# Patient Record
Sex: Female | Born: 1985 | Race: White | Hispanic: No | Marital: Married
Health system: Midwestern US, Academic
[De-identification: ages and names within clinical notes are randomized; demographics above are authoritative.]

---

## 2017-02-28 ENCOUNTER — Encounter: Admit: 2017-02-28 | Discharge: 2017-03-01

## 2017-02-28 DIAGNOSIS — R69 Illness, unspecified: Principal | ICD-10-CM

## 2017-05-02 ENCOUNTER — Encounter: Admit: 2017-05-02 | Discharge: 2017-05-03

## 2017-05-02 DIAGNOSIS — R69 Illness, unspecified: Principal | ICD-10-CM

## 2017-06-06 ENCOUNTER — Encounter: Admit: 2017-06-06 | Discharge: 2017-06-06 | Payer: Commercial Managed Care - Pharmacy Benefit Manager

## 2017-06-06 DIAGNOSIS — M25551 Pain in right hip: Principal | ICD-10-CM

## 2017-06-08 ENCOUNTER — Encounter: Admit: 2017-06-08 | Discharge: 2017-06-08 | Payer: Commercial Managed Care - Pharmacy Benefit Manager

## 2017-06-08 ENCOUNTER — Ambulatory Visit: Admit: 2017-06-08 | Discharge: 2017-06-08 | Payer: Commercial Managed Care - Pharmacy Benefit Manager

## 2017-06-08 ENCOUNTER — Ambulatory Visit: Admit: 2017-06-08 | Discharge: 2017-06-08 | Payer: Private Health Insurance - Indemnity

## 2017-06-08 DIAGNOSIS — M25551 Pain in right hip: Principal | ICD-10-CM

## 2017-06-08 DIAGNOSIS — R69 Illness, unspecified: Principal | ICD-10-CM

## 2017-06-08 DIAGNOSIS — M25859 Other specified joint disorders, unspecified hip: ICD-10-CM

## 2017-06-10 ENCOUNTER — Encounter: Admit: 2017-06-10 | Discharge: 2017-06-10 | Payer: Commercial Managed Care - Pharmacy Benefit Manager

## 2017-06-10 DIAGNOSIS — R69 Illness, unspecified: Principal | ICD-10-CM

## 2017-09-19 ENCOUNTER — Encounter: Admit: 2017-09-19 | Discharge: 2017-09-19 | Payer: Commercial Managed Care - Pharmacy Benefit Manager

## 2017-09-22 ENCOUNTER — Encounter: Admit: 2017-09-22 | Discharge: 2017-09-22 | Payer: Commercial Managed Care - Pharmacy Benefit Manager

## 2017-10-27 ENCOUNTER — Encounter: Admit: 2017-10-27 | Discharge: 2017-10-27 | Payer: Commercial Managed Care - Pharmacy Benefit Manager

## 2017-10-27 DIAGNOSIS — M25851 Other specified joint disorders, right hip: Principal | ICD-10-CM

## 2017-10-27 DIAGNOSIS — S73191D Other sprain of right hip, subsequent encounter: ICD-10-CM

## 2017-10-27 MED ORDER — LACTATED RINGERS IV SOLP
1000 mL | INTRAVENOUS | 0 refills | Status: CN
Start: 2017-10-27 — End: ?

## 2017-10-27 MED ORDER — CEFAZOLIN 1 GRAM IJ SOLR
2 g | Freq: Once | INTRAVENOUS | 0 refills | Status: CN
Start: 2017-10-27 — End: ?

## 2017-10-27 MED ORDER — LIDOCAINE (PF) 10 MG/ML (1 %) IJ SOLN
.1-2 mL | INTRAMUSCULAR | 0 refills | Status: CN | PRN
Start: 2017-10-27 — End: ?

## 2017-12-20 ENCOUNTER — Encounter: Admit: 2017-12-20 | Discharge: 2017-12-20 | Payer: Commercial Managed Care - Pharmacy Benefit Manager

## 2017-12-20 DIAGNOSIS — M25851 Other specified joint disorders, right hip: Principal | ICD-10-CM

## 2017-12-20 MED ORDER — CEFAZOLIN INJ 1GM IVP
2 g | Freq: Once | INTRAVENOUS | 0 refills | Status: CN
Start: 2017-12-20 — End: ?

## 2017-12-26 ENCOUNTER — Encounter: Admit: 2017-12-26 | Discharge: 2017-12-26 | Payer: Commercial Managed Care - Pharmacy Benefit Manager

## 2017-12-26 ENCOUNTER — Ambulatory Visit: Admit: 2017-12-26 | Discharge: 2017-12-27 | Payer: Private Health Insurance - Indemnity

## 2017-12-27 DIAGNOSIS — M25851 Other specified joint disorders, right hip: Principal | ICD-10-CM

## 2017-12-28 ENCOUNTER — Encounter: Admit: 2017-12-28 | Discharge: 2017-12-28 | Payer: Commercial Managed Care - Pharmacy Benefit Manager

## 2017-12-28 DIAGNOSIS — R112 Nausea with vomiting, unspecified: ICD-10-CM

## 2017-12-28 DIAGNOSIS — F99 Mental disorder, not otherwise specified: Principal | ICD-10-CM

## 2018-01-12 ENCOUNTER — Encounter: Admit: 2018-01-12 | Discharge: 2018-01-12 | Payer: Commercial Managed Care - Pharmacy Benefit Manager

## 2018-01-12 ENCOUNTER — Ambulatory Visit: Admit: 2018-01-12 | Discharge: 2018-01-13 | Payer: Private Health Insurance - Indemnity

## 2018-01-12 ENCOUNTER — Ambulatory Visit: Admit: 2018-01-12 | Discharge: 2018-01-12 | Payer: Commercial Managed Care - Pharmacy Benefit Manager

## 2018-01-12 ENCOUNTER — Ambulatory Visit: Admit: 2018-01-12 | Discharge: 2018-01-12 | Payer: Private Health Insurance - Indemnity

## 2018-01-12 DIAGNOSIS — S73191A Other sprain of right hip, initial encounter: ICD-10-CM

## 2018-01-12 DIAGNOSIS — R112 Nausea with vomiting, unspecified: ICD-10-CM

## 2018-01-12 DIAGNOSIS — M25851 Other specified joint disorders, right hip: Principal | ICD-10-CM

## 2018-01-12 DIAGNOSIS — F99 Mental disorder, not otherwise specified: Principal | ICD-10-CM

## 2018-01-12 MED ORDER — FENTANYL CITRATE (PF) 50 MCG/ML IJ SOLN
0 refills | Status: DC
Start: 2018-01-12 — End: 2018-01-12
  Administered 2018-01-12 (×3): 50 ug via INTRAVENOUS
  Administered 2018-01-12: 17:00:00 100 ug via INTRAVENOUS

## 2018-01-12 MED ORDER — FENTANYL CITRATE (PF) 50 MCG/ML IJ SOLN
25 ug | INTRAVENOUS | 0 refills | Status: DC | PRN
Start: 2018-01-12 — End: 2018-01-12

## 2018-01-12 MED ORDER — MEPERIDINE (PF) 25 MG/ML IJ SYRG
12.5 mg | INTRAVENOUS | 0 refills | Status: DC | PRN
Start: 2018-01-12 — End: 2018-01-12

## 2018-01-12 MED ORDER — SCOPOLAMINE BASE 1 MG OVER 3 DAYS TD PT3D
1 | Freq: Once | TRANSDERMAL | 0 refills | Status: DC
Start: 2018-01-12 — End: 2018-01-12
  Administered 2018-01-12: 16:00:00 1 via TRANSDERMAL

## 2018-01-12 MED ORDER — PROPOFOL INJ 10 MG/ML IV VIAL
0 refills | Status: DC
Start: 2018-01-12 — End: 2018-01-12
  Administered 2018-01-12: 17:00:00 200 mg via INTRAVENOUS

## 2018-01-12 MED ORDER — FAMOTIDINE (PF) 20 MG/2 ML IV SOLN
0 refills | Status: DC
Start: 2018-01-12 — End: 2018-01-12
  Administered 2018-01-12: 18:00:00 20 mg via INTRAVENOUS

## 2018-01-12 MED ORDER — NAPROXEN 500 MG PO TAB
500 mg | ORAL_TABLET | Freq: Two times a day (BID) | ORAL | 1 refills | Status: AC
Start: 2018-01-12 — End: 2018-03-06
  Filled 2018-01-12 (×2): qty 60, 30d supply, fill #1

## 2018-01-12 MED ORDER — ONDANSETRON HCL (PF) 4 MG/2 ML IJ SOLN
INTRAVENOUS | 0 refills | Status: DC
Start: 2018-01-12 — End: 2018-01-12
  Administered 2018-01-12: 19:00:00 4 mg via INTRAVENOUS

## 2018-01-12 MED ORDER — ONDANSETRON HCL (PF) 4 MG/2 ML IJ SOLN
4 mg | Freq: Once | INTRAVENOUS | 0 refills | Status: DC | PRN
Start: 2018-01-12 — End: 2018-01-12

## 2018-01-12 MED ORDER — CEFAZOLIN INJ 1GM IVP
2 g | Freq: Once | INTRAVENOUS | 0 refills | Status: CP
Start: 2018-01-12 — End: ?
  Administered 2018-01-12: 18:00:00 3 g via INTRAVENOUS

## 2018-01-12 MED ORDER — LIDOCAINE (PF) 10 MG/ML (1 %) IJ SOLN
0 refills | Status: DC
Start: 2018-01-12 — End: 2018-01-12
  Administered 2018-01-12: 19:00:00 3 mL

## 2018-01-12 MED ORDER — HALOPERIDOL LACTATE 5 MG/ML IJ SOLN
1 mg | Freq: Once | INTRAVENOUS | 0 refills | Status: DC | PRN
Start: 2018-01-12 — End: 2018-01-12

## 2018-01-12 MED ORDER — ROCURONIUM 10 MG/ML IV SOLN
INTRAVENOUS | 0 refills | Status: DC
Start: 2018-01-12 — End: 2018-01-12
  Administered 2018-01-12: 17:00:00 50 mg via INTRAVENOUS

## 2018-01-12 MED ORDER — DEXAMETHASONE SODIUM PHOSPHATE 4 MG/ML IJ SOLN
INTRAVENOUS | 0 refills | Status: DC
Start: 2018-01-12 — End: 2018-01-12
  Administered 2018-01-12: 18:00:00 5 mg via INTRAVENOUS

## 2018-01-12 MED ORDER — GABAPENTIN 300 MG PO CAP
300 mg | Freq: Once | ORAL | 0 refills | Status: CP
Start: 2018-01-12 — End: ?
  Administered 2018-01-12: 16:00:00 300 mg via ORAL

## 2018-01-12 MED ORDER — DIAZEPAM 5 MG PO TAB
5 mg | ORAL_TABLET | ORAL | 0 refills | 7.00000 days | Status: AC | PRN
Start: 2018-01-12 — End: 2018-01-27
  Filled 2018-01-12 (×2): qty 40, 10d supply, fill #1

## 2018-01-12 MED ORDER — PROPOFOL 10 MG/ML IV EMUL (INFUSION)(AM)(OR)
0 refills | Status: DC
Start: 2018-01-12 — End: 2018-01-12
  Administered 2018-01-12: 18:00:00 175 ug/kg/min via INTRAVENOUS

## 2018-01-12 MED ORDER — SUGAMMADEX 100 MG/ML IV SOLN
INTRAVENOUS | 0 refills | Status: DC
Start: 2018-01-12 — End: 2018-01-12
  Administered 2018-01-12: 19:00:00 200 mg via INTRAVENOUS

## 2018-01-12 MED ORDER — LIDOCAINE (PF) 200 MG/10 ML (2 %) IJ SYRG
0 refills | Status: DC
Start: 2018-01-12 — End: 2018-01-12
  Administered 2018-01-12: 17:00:00 60 mg via INTRAVENOUS

## 2018-01-12 MED ORDER — CELECOXIB 200 MG PO CAP
200 mg | Freq: Once | ORAL | 0 refills | Status: CP
Start: 2018-01-12 — End: ?
  Administered 2018-01-12: 16:00:00 200 mg via ORAL

## 2018-01-12 MED ORDER — LIDOCAINE (PF) 10 MG/ML (1 %) IJ SOLN
.1-2 mL | INTRAMUSCULAR | 0 refills | Status: DC | PRN
Start: 2018-01-12 — End: 2018-01-12

## 2018-01-12 MED ORDER — ROPIVACAINE (PF) 2 MG/ML (0.2 %) IJ SOLN
0 refills | Status: DC
Start: 2018-01-12 — End: 2018-01-12
  Administered 2018-01-12: 19:00:00 60 mL

## 2018-01-12 MED ORDER — DIPHENHYDRAMINE HCL 50 MG/ML IJ SOLN
25 mg | Freq: Once | INTRAVENOUS | 0 refills | Status: DC | PRN
Start: 2018-01-12 — End: 2018-01-12

## 2018-01-12 MED ORDER — LACTATED RINGERS IV SOLP
INTRAVENOUS | 0 refills | Status: DC
Start: 2018-01-12 — End: 2018-01-12
  Administered 2018-01-12 (×2): 1000.000 mL via INTRAVENOUS

## 2018-01-12 MED ORDER — LIDOCAINE (PF) 10 MG/ML (1 %) IJ SOLN
SUBCUTANEOUS | 0 refills | Status: DC
Start: 2018-01-12 — End: 2018-01-12
  Administered 2018-01-12: 19:00:00 3 mL via SUBCUTANEOUS

## 2018-01-12 MED ORDER — PROPOFOL 10 MG/ML IV EMUL 20 ML (INFUSION)(AM)(OR)
0 refills | Status: DC
Start: 2018-01-12 — End: 2018-01-12
  Administered 2018-01-12: 18:00:00 125 ug/kg/min via INTRAVENOUS

## 2018-01-12 MED ORDER — OXYCODONE-ACETAMINOPHEN 5-325 MG PO TAB
1-2 | ORAL_TABLET | ORAL | 0 refills | 2.00000 days | Status: AC | PRN
Start: 2018-01-12 — End: 2018-01-27
  Filled 2018-01-12 (×2): qty 60, 7d supply, fill #1

## 2018-01-12 MED ORDER — MIDAZOLAM 1 MG/ML IJ SOLN
INTRAVENOUS | 0 refills | Status: DC
Start: 2018-01-12 — End: 2018-01-12
  Administered 2018-01-12: 17:00:00 2 mg via INTRAVENOUS

## 2018-01-12 MED ORDER — OXYCODONE 5 MG PO TAB
5-10 mg | Freq: Once | ORAL | 0 refills | Status: CP | PRN
Start: 2018-01-12 — End: ?
  Administered 2018-01-12: 21:00:00 5 mg via ORAL

## 2018-01-12 MED ORDER — EPINEPHRINE 3MG NS 3000ML IRR(OR)
0 refills | Status: DC
Start: 2018-01-12 — End: 2018-01-12
  Administered 2018-01-12 (×2): 3000 mL

## 2018-01-12 MED ORDER — PATCH DOCUMENTATION - SCOPOLAMINE BASE 1 MG/72HR
1 | Freq: Two times a day (BID) | TRANSDERMAL | 0 refills | Status: DC
Start: 2018-01-12 — End: 2018-01-12

## 2018-01-12 MED ORDER — ACETAMINOPHEN 500 MG PO TAB
1000 mg | Freq: Once | ORAL | 0 refills | Status: CP
Start: 2018-01-12 — End: ?
  Administered 2018-01-12: 16:00:00 1000 mg via ORAL

## 2018-01-12 MED ORDER — KETAMINE 10 MG/ML IJ SOLN
0 refills | Status: DC
Start: 2018-01-12 — End: 2018-01-12
  Administered 2018-01-12 (×2): 30 mg via INTRAVENOUS

## 2018-01-12 MED ORDER — FENTANYL CITRATE (PF) 50 MCG/ML IJ SOLN
50 ug | INTRAVENOUS | 0 refills | Status: DC | PRN
Start: 2018-01-12 — End: 2018-01-12

## 2018-01-12 MED ORDER — DEXMEDETOMIDINE IN 0.9 % NACL 80 MCG/20 ML (4 MCG/ML) IV SOLN
0 refills | Status: DC
Start: 2018-01-12 — End: 2018-01-12
  Administered 2018-01-12: 18:00:00 20 ug via INTRAVENOUS

## 2018-01-12 MED ORDER — FENTANYL CITRATE (PF) 50 MCG/ML IJ SOLN
50 ug | INTRAVENOUS | 0 refills | Status: DC | PRN
Start: 2018-01-12 — End: 2018-01-12
  Administered 2018-01-12 (×2): 50 ug via INTRAVENOUS

## 2018-01-16 ENCOUNTER — Encounter: Admit: 2018-01-16 | Discharge: 2018-01-16 | Payer: Commercial Managed Care - Pharmacy Benefit Manager

## 2018-01-16 ENCOUNTER — Encounter: Admit: 2018-01-16 | Discharge: 2018-01-17 | Payer: Commercial Managed Care - Pharmacy Benefit Manager

## 2018-01-16 DIAGNOSIS — R112 Nausea with vomiting, unspecified: ICD-10-CM

## 2018-01-16 DIAGNOSIS — F99 Mental disorder, not otherwise specified: Principal | ICD-10-CM

## 2018-01-23 ENCOUNTER — Encounter: Admit: 2018-01-23 | Discharge: 2018-01-24 | Payer: Commercial Managed Care - Pharmacy Benefit Manager

## 2018-01-27 ENCOUNTER — Ambulatory Visit: Admit: 2018-01-27 | Discharge: 2018-01-28 | Payer: Private Health Insurance - Indemnity

## 2018-01-27 ENCOUNTER — Encounter: Admit: 2018-01-27 | Discharge: 2018-01-27 | Payer: Commercial Managed Care - Pharmacy Benefit Manager

## 2018-01-27 DIAGNOSIS — R112 Nausea with vomiting, unspecified: ICD-10-CM

## 2018-01-27 DIAGNOSIS — F99 Mental disorder, not otherwise specified: Principal | ICD-10-CM

## 2018-01-28 DIAGNOSIS — M899 Disorder of bone, unspecified: ICD-10-CM

## 2018-01-28 DIAGNOSIS — M25371 Other instability, right ankle: Principal | ICD-10-CM

## 2018-01-28 DIAGNOSIS — S73191D Other sprain of right hip, subsequent encounter: ICD-10-CM

## 2018-01-28 DIAGNOSIS — M25859 Other specified joint disorders, unspecified hip: ICD-10-CM

## 2018-01-28 DIAGNOSIS — M949 Disorder of cartilage, unspecified: ICD-10-CM

## 2018-01-30 ENCOUNTER — Encounter: Admit: 2018-01-30 | Discharge: 2018-01-30 | Payer: Commercial Managed Care - Pharmacy Benefit Manager

## 2018-02-02 ENCOUNTER — Encounter: Admit: 2018-02-02 | Discharge: 2018-02-02 | Payer: Commercial Managed Care - Pharmacy Benefit Manager

## 2018-02-13 ENCOUNTER — Encounter: Admit: 2018-02-13 | Discharge: 2018-02-13 | Payer: Commercial Managed Care - Pharmacy Benefit Manager

## 2018-02-17 ENCOUNTER — Encounter: Admit: 2018-02-17 | Discharge: 2018-02-17 | Payer: Commercial Managed Care - Pharmacy Benefit Manager

## 2018-02-17 DIAGNOSIS — M25571 Pain in right ankle and joints of right foot: Principal | ICD-10-CM

## 2018-02-20 ENCOUNTER — Encounter: Admit: 2018-02-20 | Discharge: 2018-02-20 | Payer: Commercial Managed Care - Pharmacy Benefit Manager

## 2018-02-20 DIAGNOSIS — R112 Nausea with vomiting, unspecified: ICD-10-CM

## 2018-02-20 DIAGNOSIS — F99 Mental disorder, not otherwise specified: Principal | ICD-10-CM

## 2018-02-21 ENCOUNTER — Encounter: Admit: 2018-02-21 | Discharge: 2018-02-21 | Payer: Commercial Managed Care - Pharmacy Benefit Manager

## 2018-02-21 ENCOUNTER — Ambulatory Visit: Admit: 2018-02-20 | Discharge: 2018-02-21 | Payer: Private Health Insurance - Indemnity

## 2018-02-21 ENCOUNTER — Ambulatory Visit: Admit: 2018-02-21 | Discharge: 2018-02-21 | Payer: Private Health Insurance - Indemnity

## 2018-02-21 ENCOUNTER — Ambulatory Visit: Admit: 2018-02-21 | Discharge: 2018-02-21 | Payer: Commercial Managed Care - Pharmacy Benefit Manager

## 2018-02-21 DIAGNOSIS — M899 Disorder of bone, unspecified: ICD-10-CM

## 2018-02-21 DIAGNOSIS — G8929 Other chronic pain: ICD-10-CM

## 2018-02-21 DIAGNOSIS — M949 Disorder of cartilage, unspecified: ICD-10-CM

## 2018-02-21 DIAGNOSIS — S73191D Other sprain of right hip, subsequent encounter: ICD-10-CM

## 2018-02-21 DIAGNOSIS — F99 Mental disorder, not otherwise specified: Principal | ICD-10-CM

## 2018-02-21 DIAGNOSIS — M25571 Pain in right ankle and joints of right foot: Principal | ICD-10-CM

## 2018-02-21 DIAGNOSIS — M25371 Other instability, right ankle: ICD-10-CM

## 2018-02-21 DIAGNOSIS — M25859 Other specified joint disorders, unspecified hip: Principal | ICD-10-CM

## 2018-02-21 DIAGNOSIS — R112 Nausea with vomiting, unspecified: ICD-10-CM

## 2018-02-23 ENCOUNTER — Encounter: Admit: 2018-02-23 | Discharge: 2018-02-23 | Payer: Commercial Managed Care - Pharmacy Benefit Manager

## 2018-02-23 DIAGNOSIS — M25861 Other specified joint disorders, right knee: ICD-10-CM

## 2018-02-23 DIAGNOSIS — M899 Disorder of bone, unspecified: ICD-10-CM

## 2018-02-23 DIAGNOSIS — M25371 Other instability, right ankle: Principal | ICD-10-CM

## 2018-02-23 DIAGNOSIS — S99911D Unspecified injury of right ankle, subsequent encounter: ICD-10-CM

## 2018-02-23 MED ORDER — CEFAZOLIN INJ 1GM IVP
2 g | Freq: Once | INTRAVENOUS | 0 refills | Status: CN
Start: 2018-02-23 — End: ?

## 2018-02-27 ENCOUNTER — Encounter: Admit: 2018-02-27 | Discharge: 2018-02-27 | Payer: Commercial Managed Care - Pharmacy Benefit Manager

## 2018-03-06 ENCOUNTER — Encounter: Admit: 2018-03-06 | Discharge: 2018-03-06 | Payer: Commercial Managed Care - Pharmacy Benefit Manager

## 2018-03-06 DIAGNOSIS — R112 Nausea with vomiting, unspecified: ICD-10-CM

## 2018-03-06 DIAGNOSIS — F99 Mental disorder, not otherwise specified: Principal | ICD-10-CM

## 2018-03-16 ENCOUNTER — Ambulatory Visit: Admit: 2018-03-16 | Discharge: 2018-03-16 | Payer: Commercial Managed Care - Pharmacy Benefit Manager

## 2018-03-16 ENCOUNTER — Ambulatory Visit: Admit: 2018-03-15 | Discharge: 2018-03-15 | Payer: Commercial Managed Care - Pharmacy Benefit Manager

## 2018-03-16 ENCOUNTER — Encounter: Admit: 2018-03-16 | Discharge: 2018-03-16 | Payer: Commercial Managed Care - Pharmacy Benefit Manager

## 2018-03-16 ENCOUNTER — Ambulatory Visit: Admit: 2018-03-16 | Discharge: 2018-03-17 | Payer: Private Health Insurance - Indemnity

## 2018-03-16 ENCOUNTER — Ambulatory Visit: Admit: 2018-03-16 | Discharge: 2018-03-16 | Payer: Private Health Insurance - Indemnity

## 2018-03-16 DIAGNOSIS — M65871 Other synovitis and tenosynovitis, right ankle and foot: ICD-10-CM

## 2018-03-16 DIAGNOSIS — M25371 Other instability, right ankle: ICD-10-CM

## 2018-03-16 DIAGNOSIS — M25871 Other specified joint disorders, right ankle and foot: Principal | ICD-10-CM

## 2018-03-16 DIAGNOSIS — S99911S Unspecified injury of right ankle, sequela: ICD-10-CM

## 2018-03-16 DIAGNOSIS — M899 Disorder of bone, unspecified: ICD-10-CM

## 2018-03-16 DIAGNOSIS — M949 Disorder of cartilage, unspecified: ICD-10-CM

## 2018-03-16 DIAGNOSIS — F329 Major depressive disorder, single episode, unspecified: ICD-10-CM

## 2018-03-16 DIAGNOSIS — R112 Nausea with vomiting, unspecified: ICD-10-CM

## 2018-03-16 DIAGNOSIS — F419 Anxiety disorder, unspecified: ICD-10-CM

## 2018-03-16 DIAGNOSIS — F99 Mental disorder, not otherwise specified: Principal | ICD-10-CM

## 2018-03-16 MED ORDER — MIDAZOLAM 1 MG/ML IJ SOLN
INTRAVENOUS | 0 refills | Status: DC
Start: 2018-03-16 — End: 2018-03-16
  Administered 2018-03-16: 13:00:00 2 mg via INTRAVENOUS

## 2018-03-16 MED ORDER — FENTANYL CITRATE (PF) 50 MCG/ML IJ SOLN
0 refills | Status: DC
Start: 2018-03-16 — End: 2018-03-16
  Administered 2018-03-16 (×2): 50 ug via INTRAVENOUS

## 2018-03-16 MED ORDER — MIDAZOLAM 1 MG/ML IJ SOLN
1-2 mg | Freq: Once | INTRAVENOUS | 0 refills | Status: CP
Start: 2018-03-16 — End: ?
  Administered 2018-03-16: 13:00:00 2 mg via INTRAVENOUS

## 2018-03-16 MED ORDER — KETAMINE 10 MG/ML IJ SOLN
0 refills | Status: DC
Start: 2018-03-16 — End: 2018-03-16
  Administered 2018-03-16: 14:00:00 30 mg via INTRAVENOUS

## 2018-03-16 MED ORDER — DEXAMETHASONE SODIUM PHOSPHATE 10 MG/ML IJ SOLN
0 refills | Status: DC
Start: 2018-03-16 — End: 2018-03-16
  Administered 2018-03-16: 14:00:00 4 mg via INTRAVENOUS

## 2018-03-16 MED ORDER — LIDOCAINE (PF) 10 MG/ML (1 %) IJ SOLN
SUBCUTANEOUS | 0 refills | Status: CP
Start: 2018-03-16 — End: ?
  Administered 2018-03-16: 13:00:00 3 mL via SUBCUTANEOUS

## 2018-03-16 MED ORDER — MEPERIDINE (PF) 25 MG/ML IJ SYRG
12.5 mg | INTRAVENOUS | 0 refills | Status: DC | PRN
Start: 2018-03-16 — End: 2018-03-16

## 2018-03-16 MED ORDER — PROPOFOL INJ 10 MG/ML IV VIAL
0 refills | Status: DC
Start: 2018-03-16 — End: 2018-03-16
  Administered 2018-03-16: 13:00:00 200 mg via INTRAVENOUS

## 2018-03-16 MED ORDER — BACITRACIN ZINC 500 UNIT/GRAM TP OINT
0 refills | Status: DC
Start: 2018-03-16 — End: 2018-03-16
  Administered 2018-03-16: 14:00:00 1 via TOPICAL

## 2018-03-16 MED ORDER — PROPOFOL 10 MG/ML IV EMUL 50 ML (INFUSION)(AM)(OR)
0 refills | Status: DC
Start: 2018-03-16 — End: 2018-03-16
  Administered 2018-03-16: 14:00:00 175 ug/kg/min via INTRAVENOUS

## 2018-03-16 MED ORDER — ACETAMINOPHEN 500 MG PO TAB
1000 mg | Freq: Once | ORAL | 0 refills | Status: CP
Start: 2018-03-16 — End: ?

## 2018-03-16 MED ORDER — GABAPENTIN 300 MG PO CAP
300 mg | Freq: Once | ORAL | 0 refills | Status: CP
Start: 2018-03-16 — End: ?

## 2018-03-16 MED ORDER — FENTANYL CITRATE (PF) 50 MCG/ML IJ SOLN
25 ug | INTRAVENOUS | 0 refills | Status: DC | PRN
Start: 2018-03-16 — End: 2018-03-16
  Administered 2018-03-16 (×2): 25 ug via INTRAVENOUS

## 2018-03-16 MED ORDER — EPINEPHRINE 3MG NS 3000ML IRR(OR)
0 refills | Status: DC
Start: 2018-03-16 — End: 2018-03-16
  Administered 2018-03-16 (×2): 12000 mL

## 2018-03-16 MED ORDER — PROMETHAZINE 25 MG/ML IJ SOLN
6.25 mg | INTRAVENOUS | 0 refills | Status: DC | PRN
Start: 2018-03-16 — End: 2018-03-16

## 2018-03-16 MED ORDER — ONDANSETRON HCL (PF) 4 MG/2 ML IJ SOLN
0 refills | Status: DC
Start: 2018-03-16 — End: 2018-03-16
  Administered 2018-03-16: 15:00:00 4 mg via INTRAVENOUS

## 2018-03-16 MED ORDER — ASPIRIN 81 MG PO TBEC
81 mg | ORAL_TABLET | Freq: Every day | ORAL | 0 refills | Status: AC
Start: 2018-03-16 — End: 2018-05-09
  Filled 2018-03-16 (×2): qty 21, 21d supply, fill #1

## 2018-03-16 MED ORDER — DOCUSATE SODIUM 100 MG PO CAP
100 mg | ORAL_CAPSULE | Freq: Two times a day (BID) | ORAL | 0 refills | Status: AC
Start: 2018-03-16 — End: 2018-03-28

## 2018-03-16 MED ORDER — OXYCODONE 5 MG PO TAB
5 mg | ORAL_TABLET | ORAL | 0 refills | 6.00000 days | Status: AC | PRN
Start: 2018-03-16 — End: 2018-03-28
  Filled 2018-03-16 (×2): qty 60, 10d supply, fill #1

## 2018-03-16 MED ORDER — PATCH DOCUMENTATION - SCOPOLAMINE BASE 1 MG/72HR
1 | Freq: Two times a day (BID) | TRANSDERMAL | 0 refills | Status: DC
Start: 2018-03-16 — End: 2018-03-16

## 2018-03-16 MED ORDER — PROPOFOL 10 MG/ML IV EMUL (INFUSION)(AM)(OR)
0 refills | Status: DC
Start: 2018-03-16 — End: 2018-03-16
  Administered 2018-03-16: 13:00:00 150 ug/kg/min via INTRAVENOUS

## 2018-03-16 MED ORDER — MIDAZOLAM 1 MG/ML IJ SOLN
INTRAVENOUS | 0 refills | Status: CP
Start: 2018-03-16 — End: ?
  Administered 2018-03-16: 13:00:00 2 mg via INTRAVENOUS

## 2018-03-16 MED ORDER — SCOPOLAMINE BASE 1 MG OVER 3 DAYS TD PT3D
1 | Freq: Once | TRANSDERMAL | 0 refills | Status: DC
Start: 2018-03-16 — End: 2018-03-16

## 2018-03-16 MED ORDER — HALOPERIDOL LACTATE 5 MG/ML IJ SOLN
1 mg | Freq: Once | INTRAVENOUS | 0 refills | Status: DC | PRN
Start: 2018-03-16 — End: 2018-03-16

## 2018-03-16 MED ORDER — ONDANSETRON HCL 4 MG PO TAB
4 mg | ORAL_TABLET | ORAL | 0 refills | 8.00000 days | Status: AC | PRN
Start: 2018-03-16 — End: 2018-03-28
  Filled 2018-03-16 (×2): qty 30, 10d supply, fill #1

## 2018-03-16 MED ORDER — ONDANSETRON HCL (PF) 4 MG/2 ML IJ SOLN
4 mg | Freq: Once | INTRAVENOUS | 0 refills | Status: DC | PRN
Start: 2018-03-16 — End: 2018-03-16

## 2018-03-16 MED ORDER — LIDOCAINE (PF) 200 MG/10 ML (2 %) IJ SYRG
0 refills | Status: DC
Start: 2018-03-16 — End: 2018-03-16
  Administered 2018-03-16: 13:00:00 50 mg via INTRAVENOUS

## 2018-03-16 MED ORDER — FENTANYL CITRATE (PF) 50 MCG/ML IJ SOLN
50 ug | INTRAVENOUS | 0 refills | Status: DC | PRN
Start: 2018-03-16 — End: 2018-03-16
  Administered 2018-03-16: 16:00:00 50 ug via INTRAVENOUS

## 2018-03-16 MED ORDER — FENTANYL CITRATE (PF) 50 MCG/ML IJ SOLN
50 ug | INTRAVENOUS | 0 refills | Status: DC | PRN
Start: 2018-03-16 — End: 2018-03-16

## 2018-03-16 MED ORDER — OXYCODONE 5 MG PO TAB
5-10 mg | Freq: Once | ORAL | 0 refills | Status: CP | PRN
Start: 2018-03-16 — End: ?
  Administered 2018-03-16: 16:00:00 10 mg via ORAL

## 2018-03-16 MED ORDER — LACTATED RINGERS IV SOLP
INTRAVENOUS | 0 refills | Status: DC
Start: 2018-03-16 — End: 2018-03-16
  Administered 2018-03-16 (×2): 1000.000 mL via INTRAVENOUS

## 2018-03-16 MED ORDER — CEFAZOLIN INJ 1GM IVP
3 g | Freq: Once | INTRAVENOUS | 0 refills | Status: CP
Start: 2018-03-16 — End: ?
  Administered 2018-03-16: 14:00:00 3 g via INTRAVENOUS

## 2018-03-16 MED ORDER — ROPIVACAINE (PF) 5 MG/ML (0.5 %) IJ SOLN
0 refills | Status: CP
Start: 2018-03-16 — End: ?
  Administered 2018-03-16: 13:00:00 40 mL

## 2018-03-16 MED ADMIN — GABAPENTIN 300 MG PO CAP [18308]: 300 mg | ORAL | @ 13:00:00 | Stop: 2018-03-16 | NDC 00904666661

## 2018-03-16 MED ADMIN — ACETAMINOPHEN 500 MG PO TAB [102]: 1000 mg | ORAL | @ 13:00:00 | Stop: 2018-03-16 | NDC 00904673061

## 2018-03-16 MED ADMIN — SCOPOLAMINE BASE 1 MG OVER 3 DAYS TD PT3D [82141]: 1 | TRANSDERMAL | @ 13:00:00 | Stop: 2018-03-16 | NDC 10019055390

## 2018-03-24 ENCOUNTER — Encounter: Admit: 2018-03-24 | Discharge: 2018-03-24 | Payer: Commercial Managed Care - Pharmacy Benefit Manager

## 2018-03-24 DIAGNOSIS — F99 Mental disorder, not otherwise specified: Principal | ICD-10-CM

## 2018-03-24 DIAGNOSIS — R112 Nausea with vomiting, unspecified: ICD-10-CM

## 2018-03-28 ENCOUNTER — Ambulatory Visit: Admit: 2018-03-28 | Discharge: 2018-03-29 | Payer: Private Health Insurance - Indemnity

## 2018-03-28 ENCOUNTER — Encounter: Admit: 2018-03-28 | Discharge: 2018-03-28 | Payer: Commercial Managed Care - Pharmacy Benefit Manager

## 2018-03-28 DIAGNOSIS — F99 Mental disorder, not otherwise specified: Principal | ICD-10-CM

## 2018-03-28 DIAGNOSIS — Z9889 Other specified postprocedural states: Secondary | ICD-10-CM

## 2018-03-28 DIAGNOSIS — R112 Nausea with vomiting, unspecified: ICD-10-CM

## 2018-03-29 DIAGNOSIS — M25371 Other instability, right ankle: Secondary | ICD-10-CM

## 2018-04-07 ENCOUNTER — Encounter: Admit: 2018-04-07 | Discharge: 2018-04-07 | Payer: Commercial Managed Care - Pharmacy Benefit Manager

## 2018-04-07 ENCOUNTER — Ambulatory Visit: Admit: 2018-04-07 | Discharge: 2018-04-08 | Payer: Private Health Insurance - Indemnity

## 2018-04-07 DIAGNOSIS — R112 Nausea with vomiting, unspecified: ICD-10-CM

## 2018-04-07 DIAGNOSIS — F99 Mental disorder, not otherwise specified: Principal | ICD-10-CM

## 2018-04-08 DIAGNOSIS — Z9889 Other specified postprocedural states: Principal | ICD-10-CM

## 2018-04-19 ENCOUNTER — Encounter: Admit: 2018-04-19 | Discharge: 2018-04-19 | Payer: Commercial Managed Care - Pharmacy Benefit Manager

## 2018-04-19 DIAGNOSIS — Z9889 Other specified postprocedural states: Principal | ICD-10-CM

## 2018-05-09 ENCOUNTER — Encounter: Admit: 2018-05-09 | Discharge: 2018-05-09 | Payer: Commercial Managed Care - Pharmacy Benefit Manager

## 2018-05-09 ENCOUNTER — Ambulatory Visit: Admit: 2018-05-09 | Discharge: 2018-05-10 | Payer: No Typology Code available for payment source

## 2018-05-09 DIAGNOSIS — F99 Mental disorder, not otherwise specified: Secondary | ICD-10-CM

## 2018-05-09 DIAGNOSIS — R112 Nausea with vomiting, unspecified: Secondary | ICD-10-CM

## 2018-05-09 DIAGNOSIS — Z9889 Other specified postprocedural states: Secondary | ICD-10-CM

## 2018-05-19 ENCOUNTER — Encounter: Admit: 2018-05-19 | Discharge: 2018-05-19 | Payer: Commercial Managed Care - Pharmacy Benefit Manager

## 2018-05-19 ENCOUNTER — Ambulatory Visit: Admit: 2018-05-19 | Discharge: 2018-05-20 | Payer: No Typology Code available for payment source

## 2018-05-19 DIAGNOSIS — S73191D Other sprain of right hip, subsequent encounter: Secondary | ICD-10-CM

## 2018-05-19 DIAGNOSIS — M25859 Other specified joint disorders, unspecified hip: Secondary | ICD-10-CM

## 2018-05-19 DIAGNOSIS — R112 Nausea with vomiting, unspecified: Secondary | ICD-10-CM

## 2018-05-19 DIAGNOSIS — F99 Mental disorder, not otherwise specified: Secondary | ICD-10-CM

## 2018-06-16 ENCOUNTER — Ambulatory Visit: Admit: 2018-06-16 | Discharge: 2018-06-17 | Payer: No Typology Code available for payment source

## 2018-06-16 ENCOUNTER — Encounter: Admit: 2018-06-16 | Discharge: 2018-06-16 | Payer: Commercial Managed Care - Pharmacy Benefit Manager

## 2018-06-16 DIAGNOSIS — Z9889 Other specified postprocedural states: Principal | ICD-10-CM

## 2018-06-16 DIAGNOSIS — F99 Mental disorder, not otherwise specified: Principal | ICD-10-CM

## 2018-06-16 DIAGNOSIS — R112 Nausea with vomiting, unspecified: ICD-10-CM

## 2018-06-22 NOTE — Progress Notes
Leslie Ochoa, Leslie Ochoa  MRN #[id number]    June 16, 2018    HISTORY OF PRESENT ILLNESS:   This is a very nice female from [place] who has had previous right Brostrom and lateral ligament reconstruction.  She has a significant hemosiderin that was inside her ankle as well.  Overall she says she can occasionally have some pain in the ankle, however it is better than it was before.    PHYSICAL EXAMINATION:   On physical examination the patient???s incisions are well healed.  Her ankle is stable to anterior/posterior drawer and 5/5 dorsiflexion, plantarflexion and eversion of the foot.  Skin is intact.  2+ DP pulse.    ASSESSMENT AND PLAN:   Status post surgery above.  The patient is going to continue physical therapy.  She will follow-up in two months with Irwin Brakeman, PA to make sure she continues to be doing better.  If it is still bother her in the future we could always order an MRI.         BV/abc:kh Abran Duke, MD      BP 143/84  - Pulse 68  - Resp 16  - Ht 172.7 cm (68)  - Wt 120.2 kg (265 lb)  - SpO2 100%  - BMI 40.29 kg/m???     No orders of the defined types were placed in this encounter.      Current Medications:  ??? escitalopram oxalate (LEXAPRO) 10 mg tablet Take 10 mg by mouth daily.     No Known Allergies      General Physical Exam:  General/Constitutional:No apparent distress: well-nourished and well developed.  Eyes: Sclera nonicteric, conjunctiva clear  Respiratory:No shortness of breath or dyspnea  Cardiac: No clubbing, cyanosis, or edema  Vascular: No edema, swelling or tenderness, except as noted in detailed exam.  Integumentary:No impressive skin lesions present, except as noted in detailed exam.  Neuro/Psych: Normal mood and affect, oriented to person, place and time.  Musculoskeletal: Normal, except as noted in detailed exam and in HPI.    ATTESTATION    I personally performed the E/M including history, physical exam, and MDM.    Staff name:  Abran Duke, MD Date:  06/22/2018 Abran Duke, MD    Portions of this noted may have been created using Dragon, a voice recognition software.  Please contact my office for any clarification of documentation.    Please send a copy of office notes to the primary care physician and referring providers.

## 2018-08-04 ENCOUNTER — Encounter: Admit: 2018-08-04 | Discharge: 2018-08-04 | Payer: Commercial Managed Care - Pharmacy Benefit Manager

## 2018-08-18 ENCOUNTER — Encounter: Admit: 2018-08-18 | Discharge: 2018-08-18 | Payer: Commercial Managed Care - Pharmacy Benefit Manager

## 2018-08-18 ENCOUNTER — Ambulatory Visit: Admit: 2018-08-18 | Discharge: 2018-08-19 | Payer: No Typology Code available for payment source

## 2018-08-19 DIAGNOSIS — Z9889 Other specified postprocedural states: Principal | ICD-10-CM

## 2018-08-19 DIAGNOSIS — M25571 Pain in right ankle and joints of right foot: ICD-10-CM

## 2018-08-19 DIAGNOSIS — G8929 Other chronic pain: ICD-10-CM

## 2018-08-23 ENCOUNTER — Encounter: Admit: 2018-08-23 | Discharge: 2018-08-23 | Payer: Commercial Managed Care - Pharmacy Benefit Manager

## 2018-08-28 ENCOUNTER — Encounter: Admit: 2018-08-28 | Discharge: 2018-08-28 | Payer: Commercial Managed Care - Pharmacy Benefit Manager

## 2018-08-28 ENCOUNTER — Encounter: Admit: 2018-08-28 | Discharge: 2018-08-29 | Payer: Commercial Managed Care - Pharmacy Benefit Manager

## 2018-08-28 DIAGNOSIS — R69 Illness, unspecified: Principal | ICD-10-CM

## 2018-09-01 ENCOUNTER — Encounter: Admit: 2018-09-01 | Discharge: 2018-09-01 | Payer: Commercial Managed Care - Pharmacy Benefit Manager

## 2018-09-01 DIAGNOSIS — R69 Illness, unspecified: Principal | ICD-10-CM

## 2018-09-08 ENCOUNTER — Ambulatory Visit: Admit: 2018-09-08 | Discharge: 2018-09-09 | Payer: No Typology Code available for payment source

## 2018-09-08 DIAGNOSIS — M25571 Pain in right ankle and joints of right foot: ICD-10-CM

## 2018-09-08 DIAGNOSIS — S4991XD Unspecified injury of right shoulder and upper arm, subsequent encounter: Principal | ICD-10-CM

## 2018-11-14 ENCOUNTER — Encounter: Admit: 2018-11-14 | Discharge: 2018-11-14

## 2018-12-07 ENCOUNTER — Ambulatory Visit: Admit: 2018-12-07 | Discharge: 2018-12-07

## 2018-12-07 ENCOUNTER — Encounter: Admit: 2018-12-07 | Discharge: 2018-12-07

## 2018-12-07 DIAGNOSIS — M25371 Other instability, right ankle: Secondary | ICD-10-CM

## 2018-12-07 DIAGNOSIS — M25571 Pain in right ankle and joints of right foot: Secondary | ICD-10-CM

## 2018-12-07 DIAGNOSIS — Z1159 Encounter for screening for other viral diseases: Secondary | ICD-10-CM

## 2018-12-07 MED ORDER — CEFAZOLIN INJ 1GM IVP
2 g | Freq: Once | INTRAVENOUS | 0 refills | Status: CN
Start: 2018-12-07 — End: ?

## 2019-01-02 ENCOUNTER — Encounter: Admit: 2019-01-02 | Discharge: 2019-01-02

## 2019-01-02 ENCOUNTER — Ambulatory Visit: Admit: 2019-01-02 | Discharge: 2019-01-03

## 2019-01-02 DIAGNOSIS — M25371 Other instability, right ankle: Secondary | ICD-10-CM

## 2019-01-02 DIAGNOSIS — F99 Mental disorder, not otherwise specified: Secondary | ICD-10-CM

## 2019-01-02 DIAGNOSIS — R112 Nausea with vomiting, unspecified: Secondary | ICD-10-CM

## 2019-01-02 NOTE — Patient Instructions
Please do not hesitate to contact my office with any questions.    Dr. Bryan Vopat & Stephanie Caldwell PA-C - Orthopedic Surgeon, Sports Medicine  [facility] - Phone [phone] - Fax [phone]   [address], [address] - [place], [place] [address]    Cobi Delph BSN, RN - Clinical Nurse Coordinator  Casey Conover BSN, RN - Clinical Nurse Coordinator  Megan Burki MS, ATC, LAT - Clinical Athletic Trainer    Thank you for supporting our practice! Your feedback helps us deliver the highest quality of care.  Review us at: [email or web address]

## 2019-01-02 NOTE — Progress Notes
Patient scheduled surgery during office visit.  Date of surgery determined based on availability of both patient and Dr. Gaspar Bidding Vopat.  The patient was scheduled for Right ankle arthroscopy, deltoid repair on 01/25/2019 at [facility].  Patient was provided with Dr. Gaspar Bidding Vopat's pre-surgery packet.      POV to be scheduled 2 weeks post op with Rosina Lowenstein, MD. She will be called with arrival date and time for day of surgery once scheduling is completed.   Questions answered and reassurance given.  Instructed to call [phone] for further questions or problems.    Verbalized understanding of the instructions given.      Dr. Alba Cory and patient have agreed to schedule procedure as extended recovery after surgery: No    Vitals:  Vitals:    01/02/19 1540   Weight: 127 kg (280 lb)   Height: 175.3 cm (69")   PainSc: Four     Body mass index is 41.35 kg/m.

## 2019-01-03 NOTE — Progress Notes
Leslie Ochoa, Leslie Ochoa  MRN #[id number]    January 02, 2019    HISTORY OF PRESENT ILLNESS:   I previously did a right ankle arthroscopy.  She had this hemosiderin.  She has increased pain over the medial side.  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  I showed the MRI to Dr. Opal Sidles as well as I am concerned about PVNS.    PHYSICAL EXAMINATION:   On examination she has tenderness to palpation over the deltoid.  Her ankle is stable to anterior drawer with 5/5 strength dorsiflexion, plantarflexion and eversion of the foot.    ASSESSMENT AND PLAN:   Right ankle pain and deltoid tear.  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  She also has this bone contusion.  We may consider a subchondroplasty as well as deltoid repair and sending tissue to path.  The risks and benefits were described to the patient including, but not limited to bleeding, infection, neurovascular injury, DVT, continued pain.  She understood these risks and consented for surgery.         BV/abc:kh Abran Duke, MD      Ht 175.3 cm (69)  - Wt 127 kg (280 lb)  - LMP 02/15/2018 (Approximate)  - BMI 41.35 kg/m???     Encounter Medications   Medications   ??? acetaminophen (TYLENOL) 500 mg tablet     Sig: Take 500 mg by mouth every 6 hours as needed for Pain. Max of 4,000 mg of acetaminophen in 24 hours.   ??? ibuprofen (MOTRIN) 400 mg tablet     Sig: Take 400 mg by mouth every 6 hours as needed for Pain. Take with food.       Current Medications:  ??? acetaminophen (TYLENOL) 500 mg tablet Take 500 mg by mouth every 6 hours as needed for Pain. Max of 4,000 mg of acetaminophen in 24 hours.   ??? escitalopram oxalate (LEXAPRO) 10 mg tablet Take 10 mg by mouth daily.   ??? ibuprofen (MOTRIN) 400 mg tablet Take 400 mg by mouth every 6 hours as needed for Pain. Take with food.     No Known Allergies      General Physical Exam:  General/Constitutional:No apparent distress: well-nourished and well developed. Eyes: Sclera nonicteric, conjunctiva clear  Respiratory:No shortness of breath or dyspnea  Cardiac: No clubbing, cyanosis, or edema  Vascular: No edema, swelling or tenderness, except as noted in detailed exam.  Integumentary:No impressive skin lesions present, except as noted in detailed exam.  Neuro/Psych: Normal mood and affect, oriented to person, place and time.  Musculoskeletal: Normal, except as noted in detailed exam and in HPI.    ATTESTATION    I personally performed the E/M including history, physical exam, and MDM.    Staff name:  Abran Duke, MD Date:  01/03/2019          Abran Duke, MD    Portions of this noted may have been created using Dragon, a voice recognition software.  Please contact my office for any clarification of documentation.    Please send a copy of office notes to the primary care physician and referring providers.

## 2019-01-12 ENCOUNTER — Encounter: Admit: 2019-01-12 | Discharge: 2019-01-12

## 2019-01-12 DIAGNOSIS — R112 Nausea with vomiting, unspecified: Secondary | ICD-10-CM

## 2019-01-12 DIAGNOSIS — F99 Mental disorder, not otherwise specified: Secondary | ICD-10-CM

## 2019-01-12 NOTE — Pre-Anesthesia Patient Instructions
GENERAL INFORMATION    Before you coe to the hospital  Make arrangeents for a responsible adult to drive you hoe and stay with you for 24 hours following surgery.  Bath/Shower Instructions  Please refer to Pre-Surgery Shower Instruction sheet.  Leave oney, credit cards, jewelry, and any other valuables at hoe. [facility] is not responsible for the loss or breakage of personal ites.  Reove nail polish fro surgery site/extreity, akeup and all jewelry (including piercings) before coing to the hospital.  The orning of your procedure:  brush your teeth and tongue  do not soke  do not shave the area where you will have surgery    What to bring to the hospital  ID/ Insurance Card  Medical Device card  Official docuents for legal guardianship   Copy of your Living Will, Advanced Directives, and/or Durable Power of Attorney   Sall bag with a few personal belongings  Cases for glasses/hearing aids/contact lens (bring solutions for contacts)  Dress in clean, loose, cofortable clothing   Other personal ites such as canes, walkers, and edications in original containers if applicable  CPAP or BiPAP Machine: If it is a [employer]/Respironics Syste One achine please bring achine/huidifier and tubing/ask. All other brands, please bring tubing/ask only on the day of surgery.     Eating or drinking before surgery  Do not eat anything after 11:00 p.. the day before your procedure (including gu, ints, candy, or chewing tobacco).  Other instructions: You ay have water until  * day of surgery.     Other instructions  Notify your surgeon if:  there is a possibility that you are pregnant  you becoe ill with a cough, fever, sore throat, nausea, voiting or flu-like syptos  you have any open wounds/sores that are red, painful, draining, or are new since you last saw  the doctor  you need to cancel your procedure    Notify us at [place]: [phone] if you need to cancel your procedure  if you are going to be late    Arrival at the hospital  [place] - Your surgery is scheduled on 01-25-19 at 1000  Please arrive at 0800  The [address] is located at [facility]. This is at the [facility] of [address]  Use the ain entrance of the hospital, at the east side of the building. Parking is free.  Check-in for surgery is inside the ain entrance.

## 2019-01-12 NOTE — Progress Notes
Phone triage assessment completed for right ankle procedure with Dr. Alba Cory on 01-25-19.  Surgery 03-2018 did well with anesthesia, denies any change in health status.  Visitor policy and instructions understood.

## 2019-01-12 NOTE — Pre-Anesthesia Medication Instructions
YOUR MEDICATIONS    Current Medications    Medication Directions   acetaminophen (TYLENOL) 500 mg tablet Take 500 mg by mouth every 6 hours as needed for Pain. Max of 4,000 mg of acetaminophen in 24 hours.   escitalopram oxalate (LEXAPRO) 10 mg tablet Take 10 mg by mouth daily.   ibuprofen (MOTRIN) 400 mg tablet Take 400 mg by mouth every 6 hours as needed for Pain. Take with food.           Before surgery  Stop these medicines 14 days before surgery:  *  Stop taking these medications 7 days prior to surgery:  ibuprofen  DO NOT take the day of surgery:    Morning of surgery  On the morning of surgery, take ONLY these medicines with a sip (1-2 ounces) of water:  lexapro    Before going home from the hospital, please ask your doctor when you should re-start your medicines that were stopped before surgery.

## 2019-01-23 ENCOUNTER — Encounter: Admit: 2019-01-23 | Discharge: 2019-01-24 | Payer: No Typology Code available for payment source

## 2019-01-24 ENCOUNTER — Encounter: Admit: 2019-01-24 | Discharge: 2019-01-24

## 2019-01-24 LAB — COVID-19 (SARS-COV-2) PCR

## 2019-01-25 ENCOUNTER — Encounter: Admit: 2019-01-25 | Discharge: 2019-01-25

## 2019-01-25 ENCOUNTER — Ambulatory Visit: Admit: 2019-01-25 | Discharge: 2019-01-25

## 2019-01-25 MED ORDER — FENTANYL CITRATE (PF) 50 MCG/ML IJ SOLN
50 ug | INTRAVENOUS | 0 refills | Status: DC | PRN
Start: 2019-01-25 — End: 2019-01-25

## 2019-01-25 MED ORDER — MIDAZOLAM 1 MG/ML IJ SOLN
1-2 mg | Freq: Once | INTRAVENOUS | 0 refills | Status: CP
Start: 2019-01-25 — End: ?
  Administered 2019-01-25: 17:00:00 2 mg via INTRAVENOUS

## 2019-01-25 MED ORDER — FENTANYL CITRATE (PF) 50 MCG/ML IJ SOLN
25 ug | INTRAVENOUS | 0 refills | Status: DC | PRN
Start: 2019-01-25 — End: 2019-01-25

## 2019-01-25 MED ORDER — BACITRACIN ZINC 500 UNIT/GRAM TP OINT
0 refills | Status: DC
Start: 2019-01-25 — End: 2019-01-25
  Administered 2019-01-25: 18:00:00 1 via TOPICAL

## 2019-01-25 MED ORDER — CEFAZOLIN INJ 1GM IVP
3 g | Freq: Once | INTRAVENOUS | 0 refills | Status: CP
Start: 2019-01-25 — End: ?
  Administered 2019-01-25: 18:00:00 3 g via INTRAVENOUS

## 2019-01-25 MED ORDER — LIDOCAINE (PF) 200 MG/10 ML (2 %) IJ SYRG
0 refills | Status: DC
Start: 2019-01-25 — End: 2019-01-25
  Administered 2019-01-25: 18:00:00 70 mg via INTRAVENOUS

## 2019-01-25 MED ORDER — SCOPOLAMINE BASE 1 MG OVER 3 DAYS TD PT3D
1 | Freq: Once | TRANSDERMAL | 0 refills | Status: DC
Start: 2019-01-25 — End: 2019-01-25
  Administered 2019-01-25: 17:00:00 1 via TRANSDERMAL

## 2019-01-25 MED ORDER — FENTANYL CITRATE (PF) 50 MCG/ML IJ SOLN
0 refills | Status: DC
Start: 2019-01-25 — End: 2019-01-25
  Administered 2019-01-25 (×2): 25 ug via INTRAVENOUS

## 2019-01-25 MED ORDER — OXYCODONE 5 MG PO TAB
5-10 mg | Freq: Once | ORAL | 0 refills | Status: CP | PRN
Start: 2019-01-25 — End: ?
  Administered 2019-01-25: 20:00:00 10 mg via ORAL

## 2019-01-25 MED ORDER — PROMETHAZINE 25 MG/ML IJ SOLN
6.25 mg | INTRAVENOUS | 0 refills | Status: DC | PRN
Start: 2019-01-25 — End: 2019-01-25

## 2019-01-25 MED ORDER — ONDANSETRON HCL 4 MG PO TAB
4 mg | ORAL_TABLET | ORAL | 0 refills | 8.00000 days | Status: DC | PRN
Start: 2019-01-25 — End: 2019-02-09
  Filled 2019-01-25: qty 30, 10d supply, fill #1

## 2019-01-25 MED ORDER — DOCUSATE SODIUM 100 MG PO CAP
100 mg | ORAL_CAPSULE | Freq: Two times a day (BID) | ORAL | 0 refills | Status: DC
Start: 2019-01-25 — End: 2019-02-09

## 2019-01-25 MED ORDER — ONDANSETRON HCL (PF) 4 MG/2 ML IJ SOLN
4 mg | Freq: Once | INTRAVENOUS | 0 refills | Status: CP | PRN
Start: 2019-01-25 — End: ?
  Administered 2019-01-25: 20:00:00 4 mg via INTRAVENOUS

## 2019-01-25 MED ORDER — PATCH DOCUMENTATION - SCOPOLAMINE BASE 1 MG/72HR
1 | Freq: Two times a day (BID) | TRANSDERMAL | 0 refills | Status: DC
Start: 2019-01-25 — End: 2019-01-25

## 2019-01-25 MED ORDER — ROPIVACAINE (PF) 5 MG/ML (0.5 %) IJ SOLN
0 refills | Status: CP
Start: 2019-01-25 — End: ?
  Administered 2019-01-25: 18:00:00 40 mL

## 2019-01-25 MED ORDER — MEPERIDINE (PF) 25 MG/ML IJ SYRG
12.5 mg | INTRAVENOUS | 0 refills | Status: DC | PRN
Start: 2019-01-25 — End: 2019-01-25

## 2019-01-25 MED ORDER — LIDOCAINE (PF) 10 MG/ML (1 %) IJ SOLN
.1-2 mL | INTRAMUSCULAR | 0 refills | Status: DC | PRN
Start: 2019-01-25 — End: 2019-01-25

## 2019-01-25 MED ORDER — FENTANYL CITRATE (PF) 50 MCG/ML IJ SOLN
50 ug | INTRAVENOUS | 0 refills | Status: DC | PRN
Start: 2019-01-25 — End: 2019-01-25
  Administered 2019-01-25: 19:00:00 50 ug via INTRAVENOUS

## 2019-01-25 MED ORDER — ASPIRIN 81 MG PO TBEC
81 mg | ORAL_TABLET | Freq: Every day | ORAL | 0 refills | Status: AC
Start: 2019-01-25 — End: ?
  Filled 2019-01-25: qty 60, 24d supply, fill #2
  Filled 2019-01-25: qty 60, 6d supply, fill #1

## 2019-01-25 MED ORDER — ONDANSETRON HCL (PF) 4 MG/2 ML IJ SOLN
INTRAVENOUS | 0 refills | Status: DC
Start: 2019-01-25 — End: 2019-01-25
  Administered 2019-01-25: 18:00:00 4 mg via INTRAVENOUS

## 2019-01-25 MED ORDER — HALOPERIDOL LACTATE 5 MG/ML IJ SOLN
1 mg | Freq: Once | INTRAVENOUS | 0 refills | Status: DC | PRN
Start: 2019-01-25 — End: 2019-01-25

## 2019-01-25 MED ORDER — OXYCODONE 5 MG PO TAB
5 mg | ORAL_TABLET | ORAL | 0 refills | 6.00000 days | Status: DC | PRN
Start: 2019-01-25 — End: 2019-02-09
  Filled 2019-01-25: qty 30, 5d supply

## 2019-01-25 MED ORDER — KETAMINE 10 MG/ML IJ SOLN
0 refills | Status: DC
Start: 2019-01-25 — End: 2019-01-25
  Administered 2019-01-25: 18:00:00 30 mg via INTRAVENOUS

## 2019-01-25 MED ORDER — LACTATED RINGERS IV SOLP
INTRAVENOUS | 0 refills | Status: DC
Start: 2019-01-25 — End: 2019-01-25
  Administered 2019-01-25: 16:00:00 1000.000 mL via INTRAVENOUS

## 2019-01-25 MED ORDER — DEXAMETHASONE SODIUM PHOSPHATE 4 MG/ML IJ SOLN
INTRAVENOUS | 0 refills | Status: DC
Start: 2019-01-25 — End: 2019-01-25
  Administered 2019-01-25: 18:00:00 4 mg via INTRAVENOUS

## 2019-01-25 MED ORDER — ACETAMINOPHEN 500 MG PO TAB
1000 mg | Freq: Once | ORAL | 0 refills | Status: CP
Start: 2019-01-25 — End: ?
  Administered 2019-01-25: 17:00:00 1000 mg via ORAL

## 2019-01-25 MED ORDER — EPINEPHRINE 3MG NS 3000ML IRR(OR)
0 refills | Status: DC
Start: 2019-01-25 — End: 2019-01-25
  Administered 2019-01-25 (×2): 12000 mL

## 2019-01-25 MED ORDER — PROPOFOL INJ 10 MG/ML IV VIAL
0 refills | Status: DC
Start: 2019-01-25 — End: 2019-01-25
  Administered 2019-01-25: 18:00:00 200 mg via INTRAVENOUS

## 2019-01-25 NOTE — Anesthesia Pre-Procedure Evaluation
Anesthesia Pre-Procedure Evaluation    Name: Leslie Ochoa      MRN: [id number]     DOB: 08/13/1985     Age: 33 y.o.     Sex: female   _________________________________________________________________________     Procedure Date: 01/25/2019   Procedure(s) (LRB):  ARTHROSCOPY ANKLE WITH EXTENSIVE DEBRIDEMENT (Right)  REPAIR PRIMARY DISRUPTED LIGAMENT ANKLE - COLLATERAL (Right)      Physical Assessment  Vital Signs (last filed in past 24 hours):         Patient History   No Known Allergies     Current Medications    Medication Directions   acetaminophen (TYLENOL) 500 mg tablet Take 500 mg by mouth every 6 hours as needed for Pain. Max of 4,000 mg of acetaminophen in 24 hours.   escitalopram oxalate (LEXAPRO) 10 mg tablet Take 10 mg by mouth daily.   ibuprofen (MOTRIN) 400 mg tablet Take 400 mg by mouth every 6 hours as needed for Pain. Take with food.         Review of Systems/Medical History      Patient summary reviewed  Nursing notes reviewed  Pertinent labs reviewed    PONV Screening: Female gender, Non-smoker, Hx PONV/motion sickness and Postoperative opioids  History of anesthetic complications (PONV)  No family history of anesthetic complications      Airway - negative        Pulmonary - negative          Cardiovascular         Exercise tolerance: >4 METS      Beta Blocker therapy: No      Beta blockers within 24 hours: n/a      No hypertension,       No valvular problems/murmurs      No past MI,       No dysrhythmias      No angina      No indications/hx of CHF      No orthopnea      Denies any chest pain, orthopnea, DOE, h/o MI, CHF, significant valvular lesion or malignant arrhyhtmia. METS > 4 per patient without any cardiopulmonary symptoms.        GI/Hepatic/Renal - negative        Neuro/Psych           Psychiatric history          Depression      Musculoskeletal         Right ankle instability      Endocrine/Other         Obesity (bmi 41)    Constitution - negative   Physical Exam    Airway Findings Mallampati: II      TM distance: >3 FB      Neck ROM: full      Mouth opening: good      Airway patency: adequate    Dental Findings: Negative      Cardiovascular Findings:       Rhythm: regular      Rate: normal    Pulmonary Findings: Negative      Abdominal Findings:         Abdominal exam deferred    Neurological Findings: Negative      Constitutional findings: Negative       Diagnostic Tests  Hematology: No results found for: HGB, HCT, PLTCT, WBC, NEUT, ANC, LYMPH, ALC, ABSLYMPHCT, MONA, AMC, EOSA, ABC, BASOPHILS, MCV, MCH, MCHC, MPV, RDW      General  Chemistry: No results found for: NA, K, CL, CO2, GAP, BUN, CR, GLU, CA, KETONES, ALBUMIN, LACTIC, OBSCA, MG, TOTBILI, TOTBILCB, PO4   Coagulation: No results found for: PT, PTT, INR      Anesthesia Plan    ASA score: 2   Plan: general and regional for postoperative pain  Induction method: intravenous  NPO status: acceptable      Informed Consent  Anesthetic plan and risks discussed with patient.  Use of blood products discussed with patient      Plan discussed with: surgeon/proceduralist, CRNA and anesthesiologist.  Comments: (Discussed risks/benefits of regional anesthesia for post-op pain control with the patient. They voiced understanding and will proceed at the request of Dr. Essie Christine.    Will plan for TIVA with propofol for PONV ppx.)

## 2019-01-25 NOTE — Anesthesia Post-Procedure Evaluation
Post-Anesthesia Evaluation    Name: Leslie Ochoa      MRN: [id number]     DOB: Jan 31, 1986     Age: 33 y.o.     Sex: female   __________________________________________________________________________     Procedure Information     Anesthesia Start Date/Time:  01/25/19 1245    Procedures:       ARTHROSCOPY ANKLE WITH EXTENSIVE DEBRIDEMENT (Right Ankle)      DELTOID REPAIR (Right Ankle)    Location:  [facility]    Surgeon:  Ezekiel Ina, MD          Post-Anesthesia Vitals  BP: 130/88 (09/24 1500)  Temp: 36.6 C (97.9 F) (09/24 1415)  Pulse: 64 (09/24 1500)  Respirations: 8 PER MINUTE (09/24 1500)  SpO2: 95 % (09/24 1500)  SpO2 Pulse: 65 (09/24 1500)  Height: 175.3 cm (69") (09/24 1057)   Vitals Value Taken Time   BP 130/88 01/25/2019  3:00 PM   Temp 36.6 C (97.9 F) 01/25/2019  2:15 PM   Pulse 64 01/25/2019  3:00 PM   Respirations 8 PER MINUTE 01/25/2019  3:00 PM   SpO2 95 % 01/25/2019  3:00 PM         Post Anesthesia Evaluation Note    Evaluation location: pre/post  Patient participation: recovered; patient participated in evaluation  Level of consciousness: alert    Pain score: 2  Pain management: adequate    Hydration: normovolemia  Temperature: 36.0C - 38.4C  Airway patency: adequate    Regional/Neuraxial:       Neurological status: sensory deficit      Single injection shot performed    Perioperative Events       Post-op nausea and vomiting: no PONV    Postoperative Status  Cardiovascular status: hemodynamically stable  Respiratory status: spontaneous ventilation        Perioperative Events  Perioperative Event: No  Emergency Case Activation: No

## 2019-01-25 NOTE — Anesthesia Procedure Notes
Anesthesia Procedure: Peripheral Nerve Block    PERIPHERAL NERVE BLOCK    Date/Time: 01/25/2019 12:31 PM    Patient location: pre-op  Reason for block: at surgeon's request and post-op pain management    Preprocedure checklist performed: 2 patient identifiers, risks & benefits discussed, patient evaluated, timeout performed, consent obtained, patient being monitored and sterile drape    Sterile technique:  - Proper hand washing  - Cap, mask  - Sterile gloves  - Skin prep for antisepsis        Peripheral Nerve Block Procedure   Patient position: supine  Prep: ChloraPrep    Monitoring: BP, EKG and continuous pulse ox  Block type: popliteal and saphenous  Laterality: right  Injection technique: single-shot  Procedures: ultrasound guided    Ultrasound image captured    Needle/cathether:      Needle type: Stimuplex      Needle gauge: 21 G; Needle length: 4 in     Needle location: ultrasound guidance    Procedure Medications    Bolus Dose: ropivacaine (PF) 0.5% (NAROPIN) injection, 40 mL    Procedure Outcome   Injection assessment: negative aspiration for heme, no paresthesia on injection, incremental injection and local visualized surrounding nerve on ultrasound  Observations: adequate block, comfortable throughout block, patient sedated but conversant throughout block and patient tolerated the procedure well with no immediate complications    Additional notes: 30cc of 0.5% Ropi for popliteal, 10cc of 0.5% Ropi for saphenous    Refer to nursing documentation for vitals and monitoring data during procedure.    Performed by: Aurther Loft, MD  Authorized by: Aurther Loft, MD

## 2019-01-26 ENCOUNTER — Encounter: Admit: 2019-01-26 | Discharge: 2019-01-26

## 2019-01-26 NOTE — Telephone Encounter
Post-operative packet and surgery photos received from Dr. Voapt and Stephanie Caldwell PA-C. Email sent to Pt/patient family including photos and post operative instructions.

## 2019-01-27 ENCOUNTER — Encounter: Admit: 2019-01-27 | Discharge: 2019-01-27

## 2019-02-09 ENCOUNTER — Encounter: Admit: 2019-02-09 | Discharge: 2019-02-09

## 2019-02-09 ENCOUNTER — Ambulatory Visit: Admit: 2019-02-09 | Discharge: 2019-02-09 | Payer: No Typology Code available for payment source

## 2019-02-09 DIAGNOSIS — R112 Nausea with vomiting, unspecified: Secondary | ICD-10-CM

## 2019-02-09 DIAGNOSIS — F99 Mental disorder, not otherwise specified: Secondary | ICD-10-CM

## 2019-02-09 DIAGNOSIS — Z9889 Other specified postprocedural states: Secondary | ICD-10-CM

## 2019-02-09 DIAGNOSIS — S93421D Sprain of deltoid ligament of right ankle, subsequent encounter: Secondary | ICD-10-CM

## 2019-02-12 ENCOUNTER — Encounter: Admit: 2019-02-12 | Discharge: 2019-02-12

## 2019-02-12 NOTE — Progress Notes
Short Term Disability paperwork received, completed, and signed.  Copy sent to requested location ([facility] at Fax #: [phone]) & medical records for scanning.

## 2019-03-09 ENCOUNTER — Encounter: Admit: 2019-03-09 | Discharge: 2019-03-09

## 2019-03-09 ENCOUNTER — Ambulatory Visit: Admit: 2019-03-09 | Discharge: 2019-03-09

## 2019-03-09 ENCOUNTER — Ambulatory Visit: Admit: 2019-03-09 | Discharge: 2019-03-09 | Payer: No Typology Code available for payment source

## 2019-03-09 DIAGNOSIS — M25571 Pain in right ankle and joints of right foot: Secondary | ICD-10-CM

## 2019-03-09 DIAGNOSIS — R112 Nausea with vomiting, unspecified: Secondary | ICD-10-CM

## 2019-03-09 DIAGNOSIS — S93421D Sprain of deltoid ligament of right ankle, subsequent encounter: Secondary | ICD-10-CM

## 2019-03-09 DIAGNOSIS — F99 Mental disorder, not otherwise specified: Secondary | ICD-10-CM

## 2019-03-09 MED ORDER — MELOXICAM 7.5 MG PO TAB
7.5 mg | ORAL_TABLET | Freq: Every day | ORAL | 0 refills | 30.00000 days | Status: AC
Start: 2019-03-09 — End: ?

## 2019-03-09 NOTE — Patient Instructions
Mobic 7.5mg  given today  Take one tablet daily with food  Discontinue if experience stomach discomfort  Do not take any additional NSAIDS (Naprosyn, Ibuprofen) while on this medication    Continue ice and elevation    D/c boot, use velocity ankle brace    Continue PT.    Please do not hesitate to contact my office with any questions.    Dr. Gaspar Bidding Vopat & Penelope Coop PA-C - Orthopedic Surgeon, Sports Medicine  [facility] - Phone [phone] - Fax [phone]   [address], [place], [place]    Jenell Milliner BSN, RN - Clinical Nurse Coordinator  Lupita Leash BSN, RN - Clinical Nurse Coordinator  Adan Sis MS, ATC, LAT - Clinical Athletic Trainer    Thank you for supporting our practice! Your feedback helps [place] deliver the highest quality of care.  Review [place] at: [email or web address]

## 2019-04-20 ENCOUNTER — Ambulatory Visit: Admit: 2019-04-20 | Discharge: 2019-04-21 | Payer: No Typology Code available for payment source

## 2019-04-20 DIAGNOSIS — Z9889 Other specified postprocedural states: Secondary | ICD-10-CM

## 2019-04-20 NOTE — Patient Instructions
Please do not hesitate to contact my office with any questions.    Dr. Bryan Vopat & Stephanie Caldwell PA-C - Orthopedic Surgeon, Sports Medicine  [facility] - Phone [phone] - Fax [phone]   [address], [address] - [place], [place] [address]    Kara Schuessler BSN, RN - Clinical Nurse Coordinator  Yarelie Hams BSN, RN - Clinical Nurse Coordinator  Megan Burki MS, ATC, LAT - Clinical Athletic Trainer    Thank you for supporting our practice! Your feedback helps us deliver the highest quality of care.  Review us at: [email or web address]

## 2019-04-29 IMAGING — CR LOW_EXM
3 series · 3 of 3 positions shown · non-contrast
Comparison: none

[ankle ap]
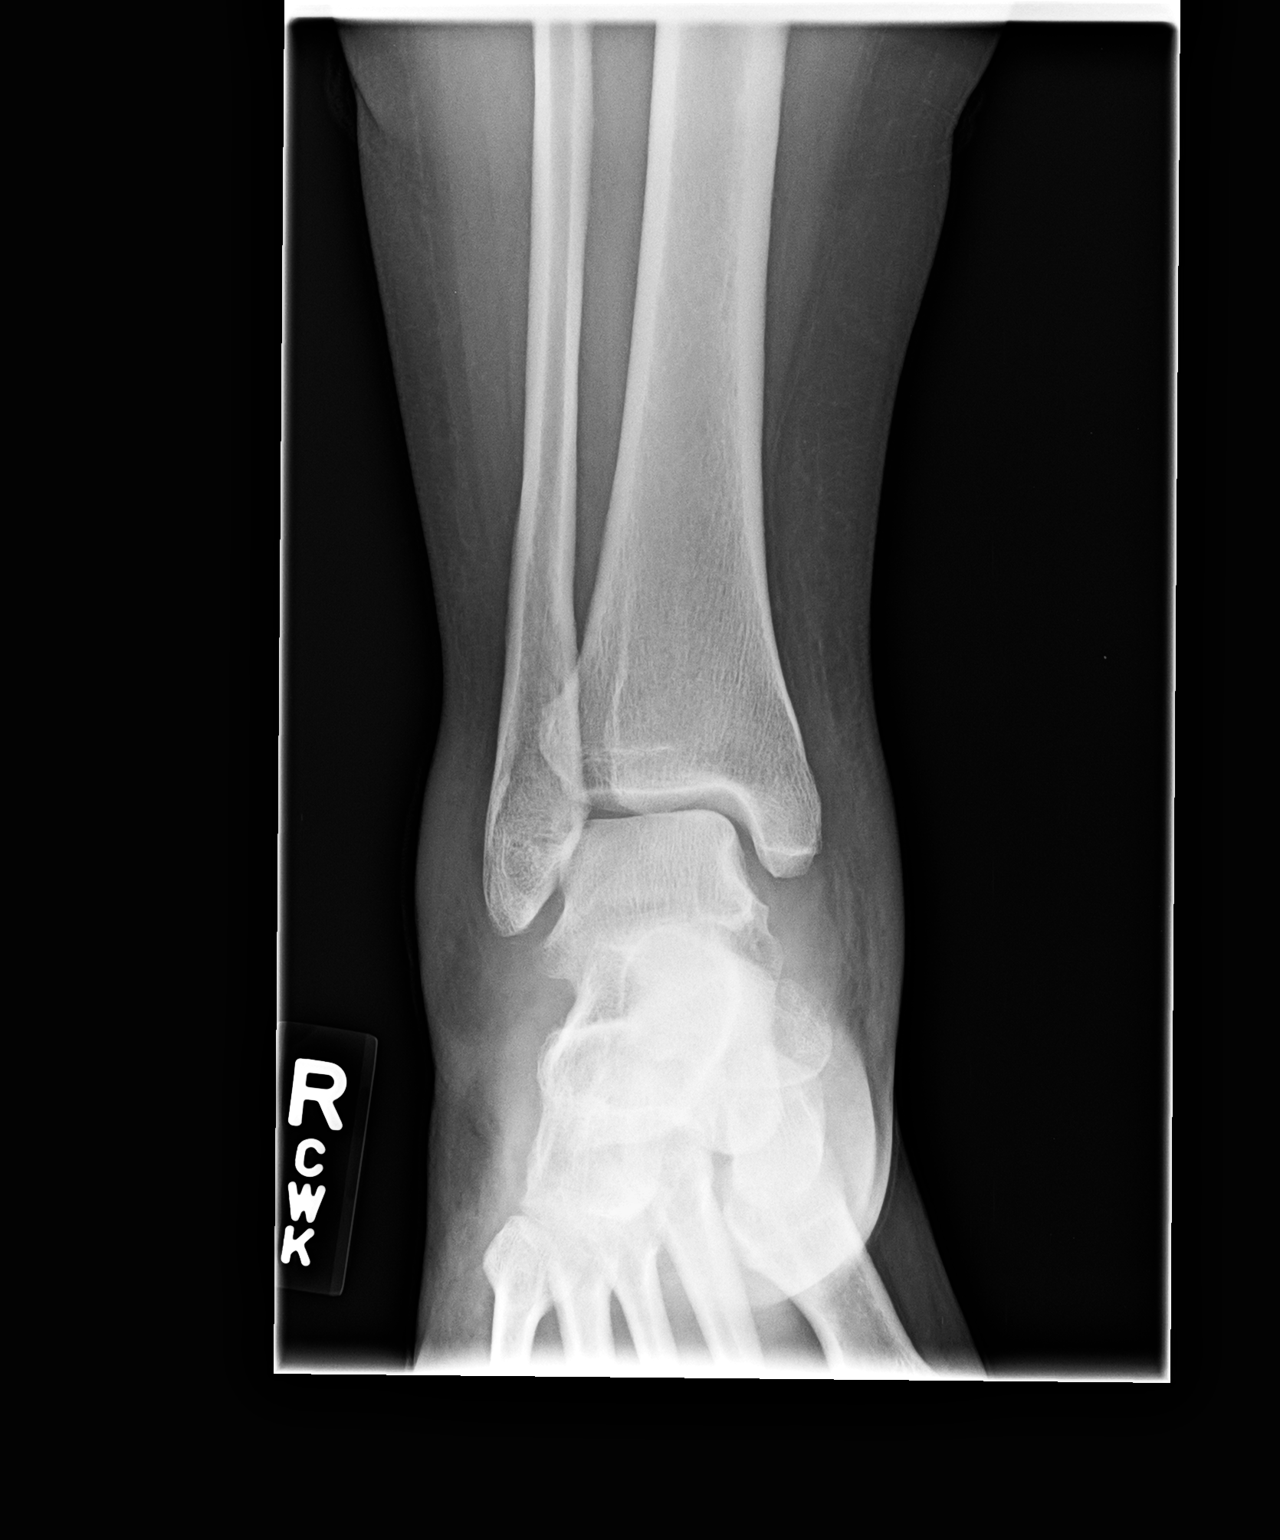

[ankle obl]
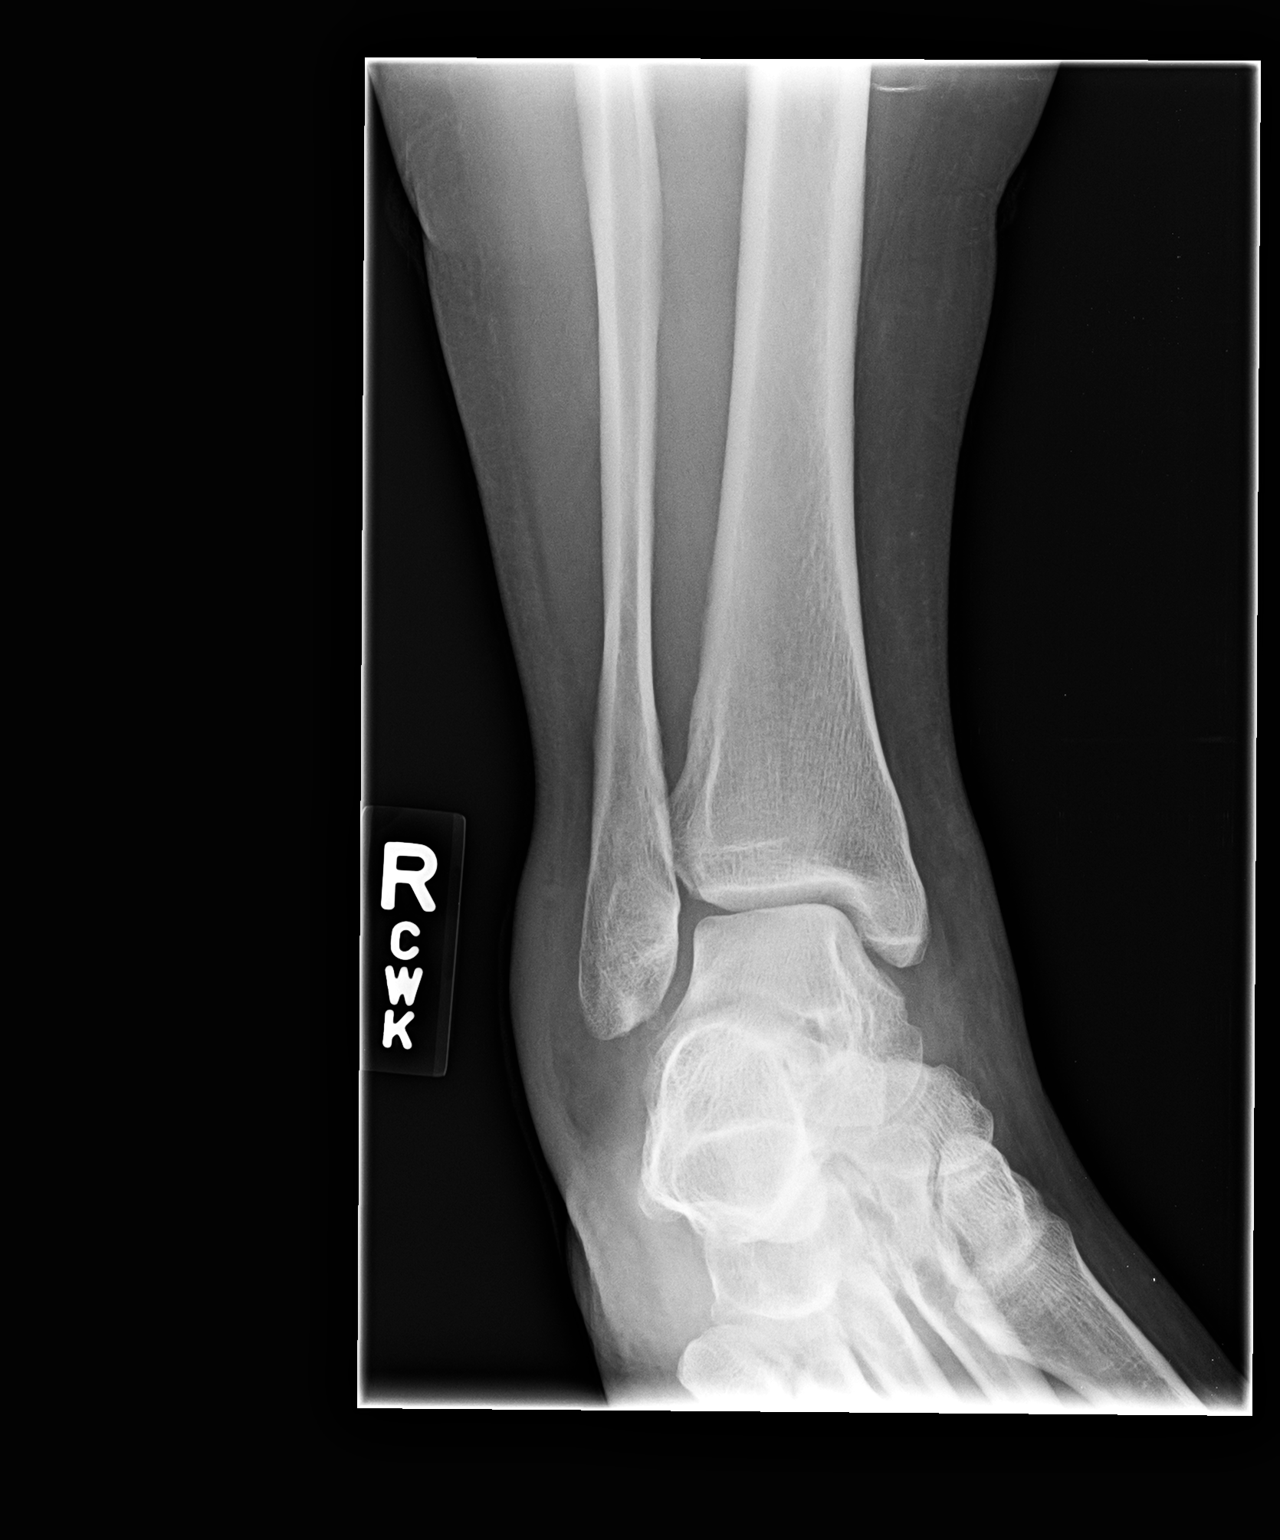

[ankle lat]
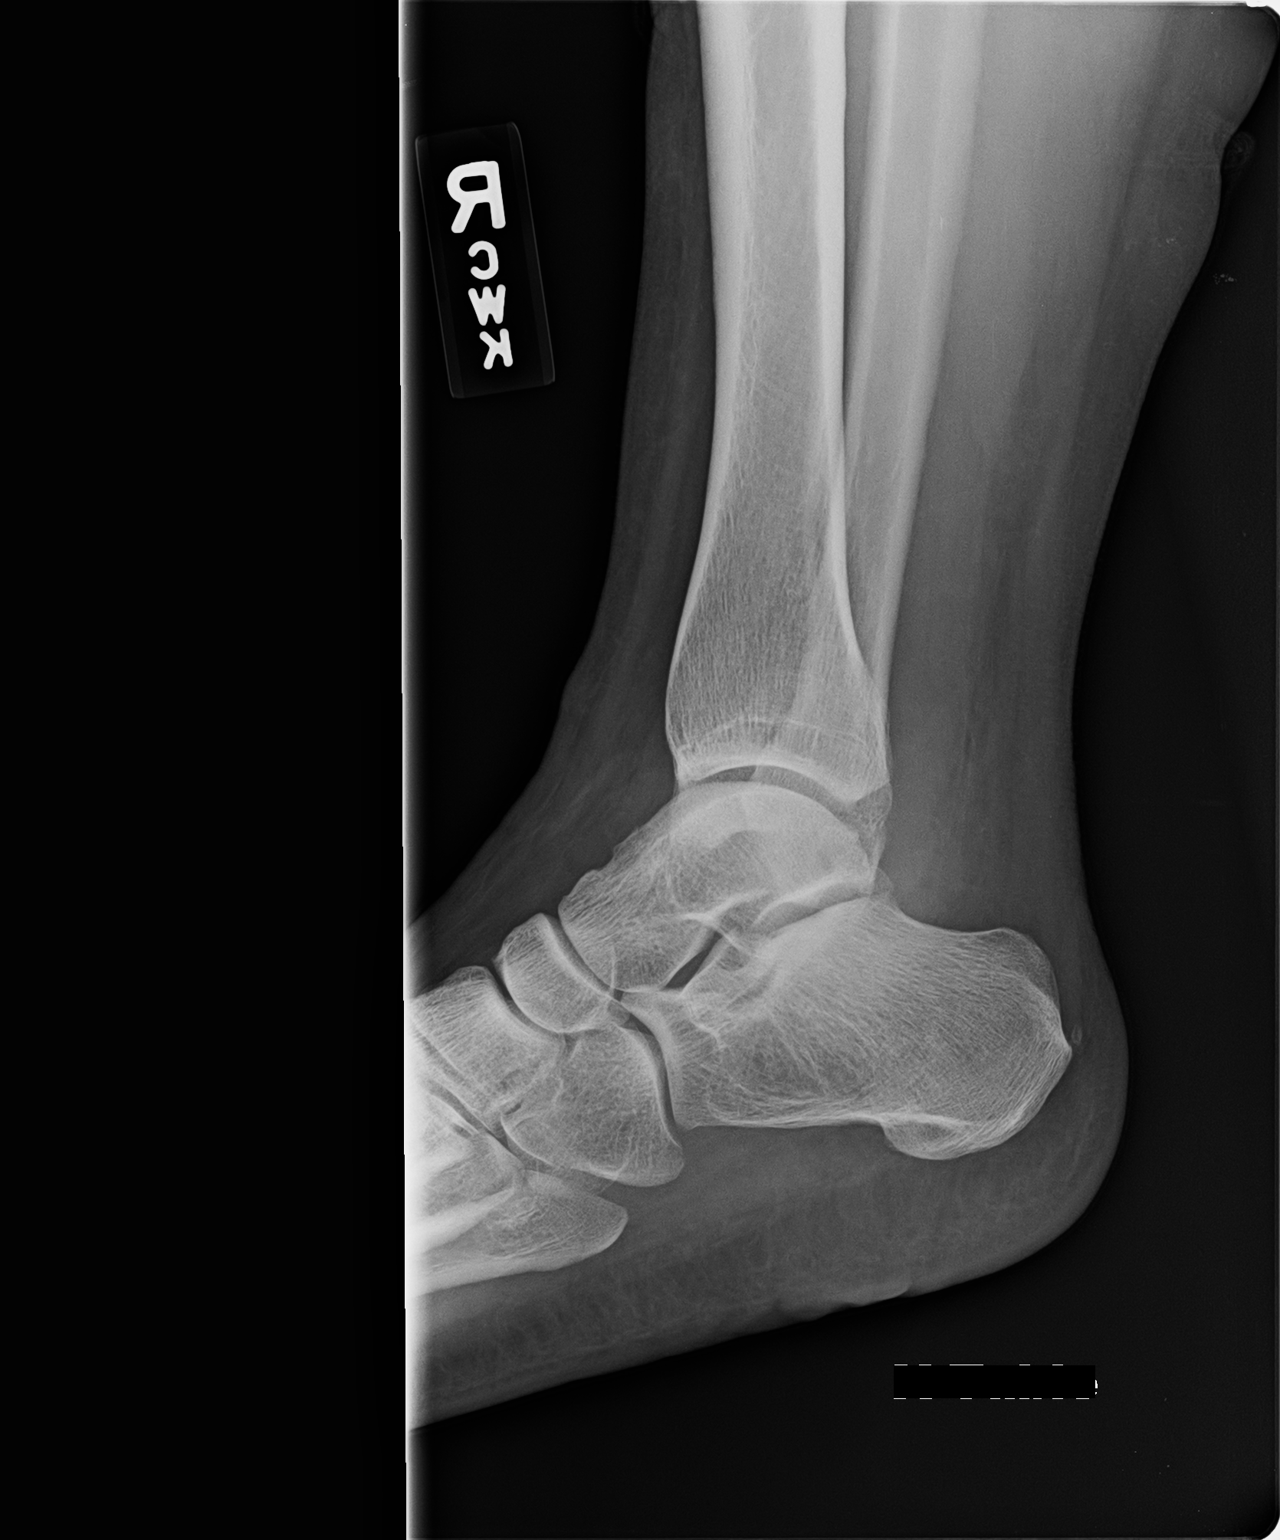

[3 of 3 positions shown; findings below may reference images not displayed]

DIAGNOSTIC STUDIES

EXAM
Right ankle

INDICATION
right ankle pain
pt. c/o rt ankle pain. shielded.

TECHNIQUE
AP lateral and oblique views of right ankle were obtained.

COMPARISONS
September 02, 2017

FINDINGS
Soft tissue swelling is seen over the lateral malleolus. No fractures are evident. Joint spaces are
well maintained.

IMPRESSION
No fractures are evident. Soft tissue swelling over the lateral malleolus is noted.

Tech Notes:

pt. c/o rt ankle pain. shielded.

## 2021-08-13 IMAGING — US ABD COMPLETE
1 series · 14 of 25 positions shown · non-contrast
Comparison: No relevant prior studies available.

EXAM:  US ABDOMEN COMPLETE  (86888)
INDICATION: Enlarged liver
TECHNIQUE: Real-time ultrasound of the abdomen with image documentation.

[Series 1: us abdomen limited · 14 of 37 slices shown]
[im 1/37]
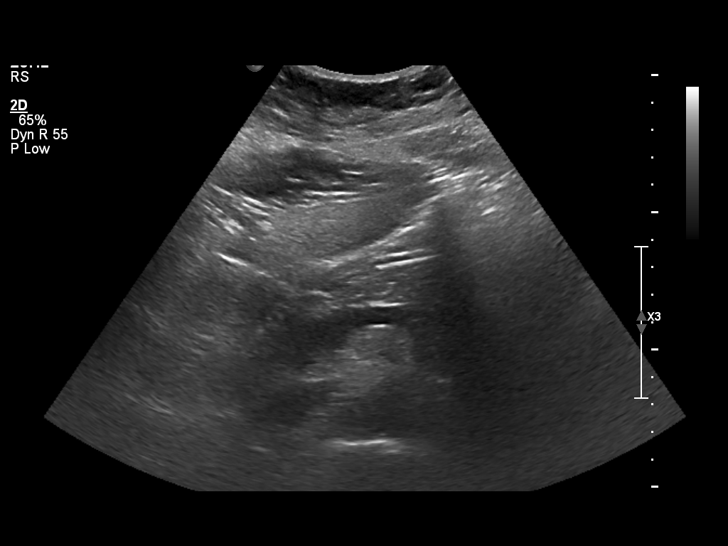
[im 4/37]
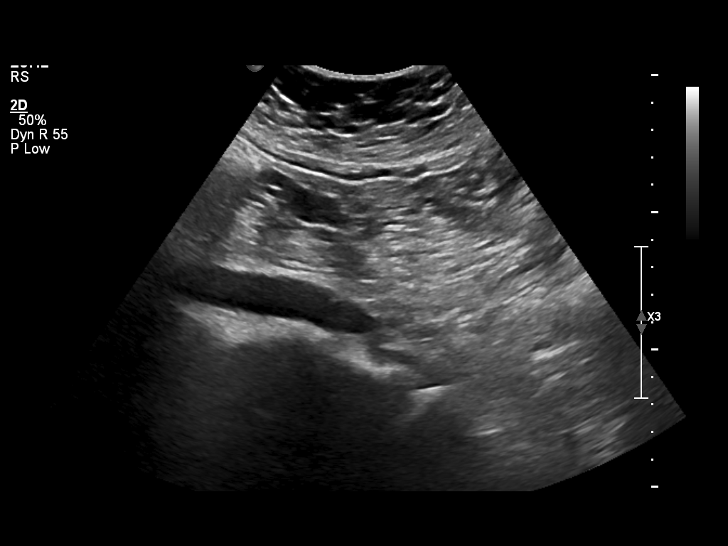
[im 7/37]
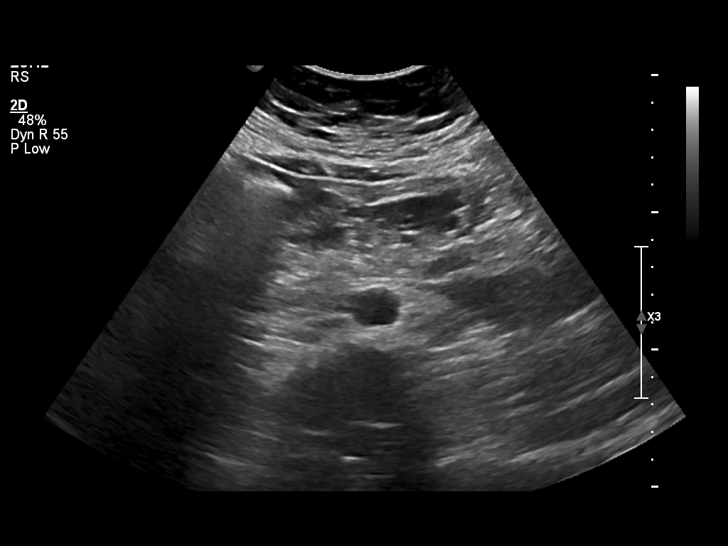
[im 10/37]
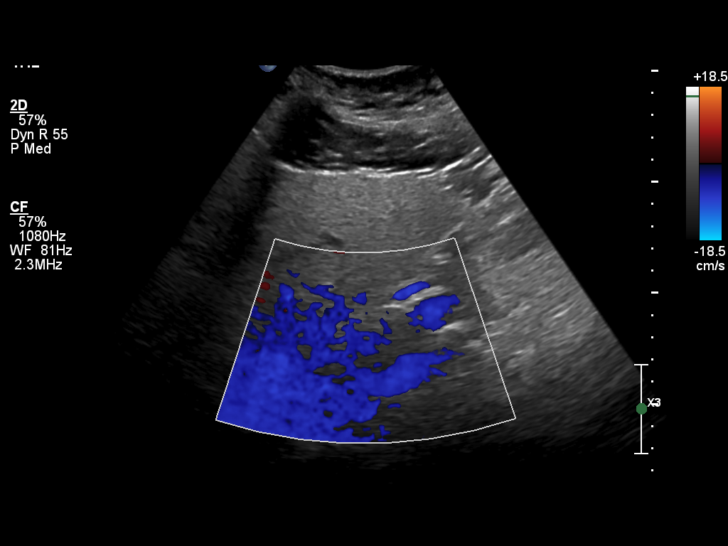
[im 13/37]
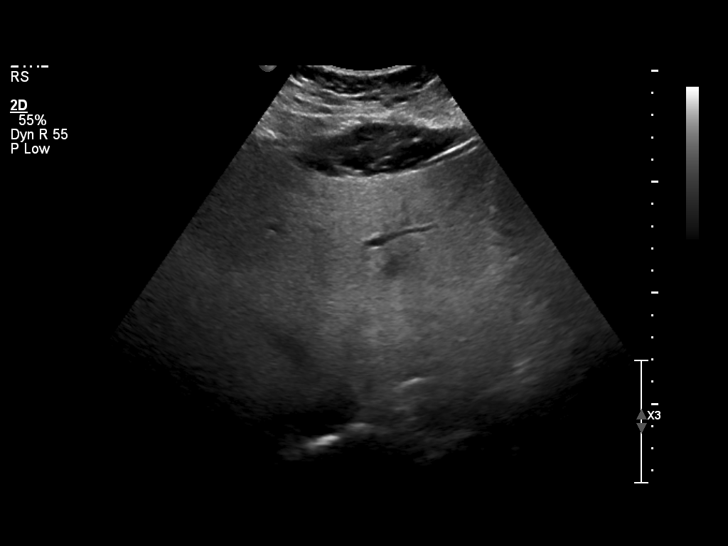
[im 14/37]
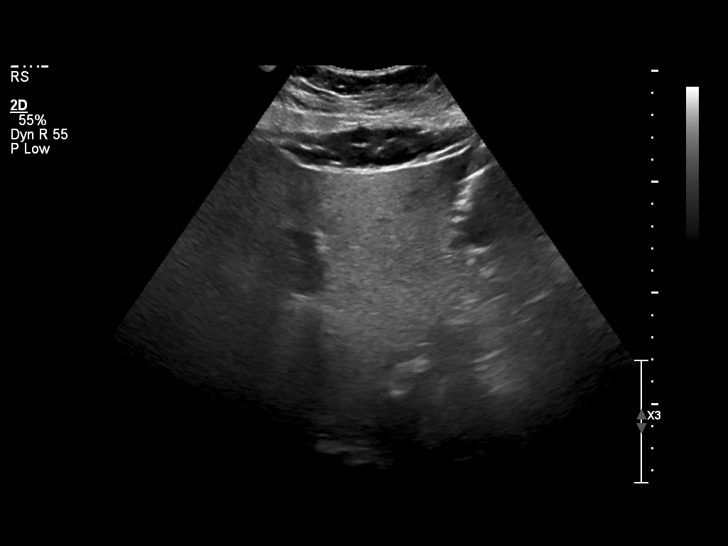
[im 17/37]
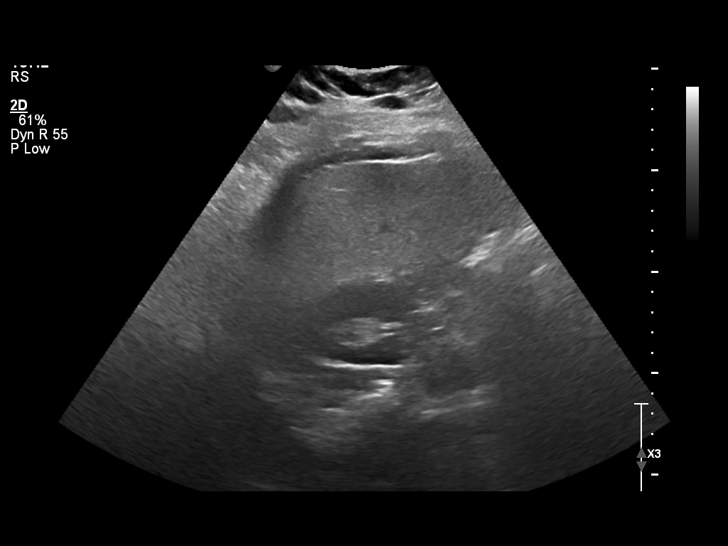
[im 20/37]
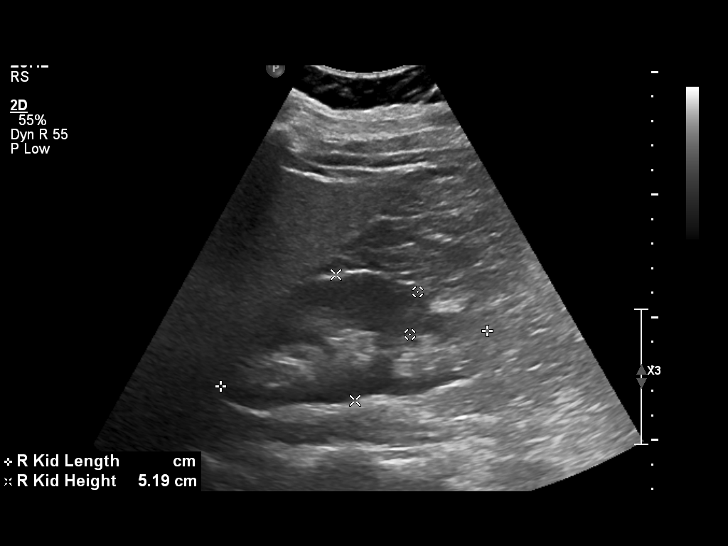
[im 23/37]
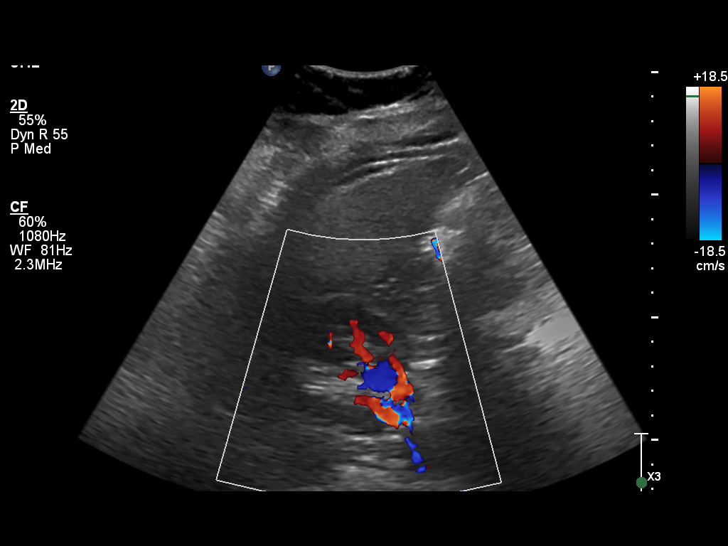
[im 25/37]
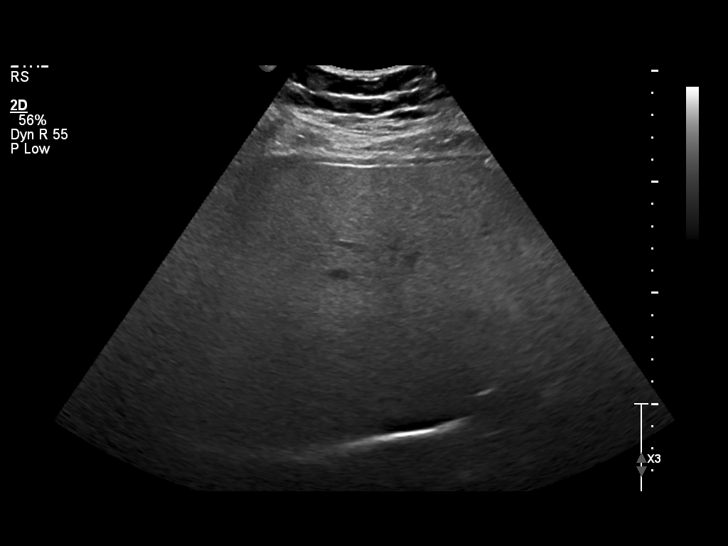
[im 28/37]
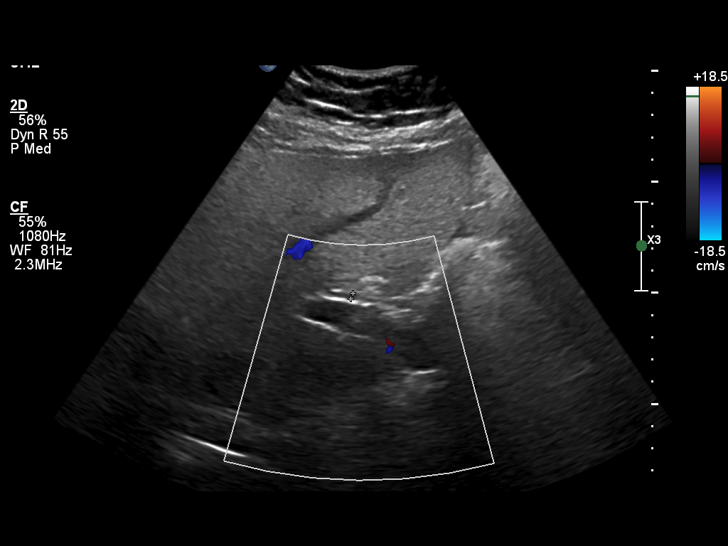
[im 31/37]
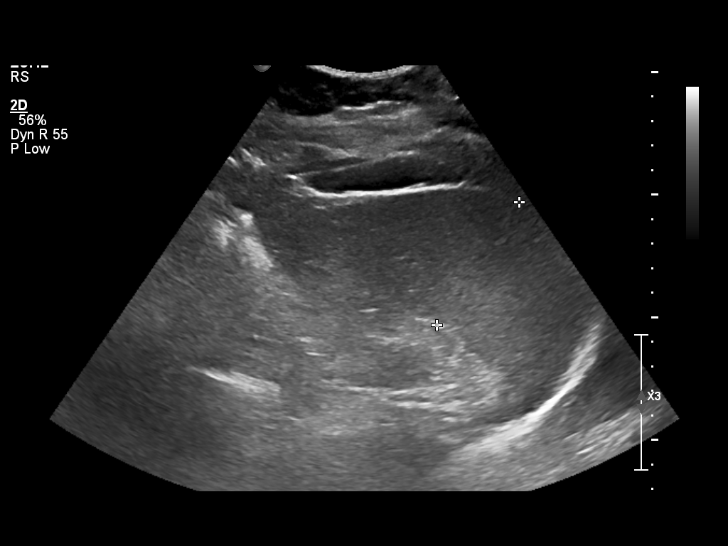
[im 34/37]
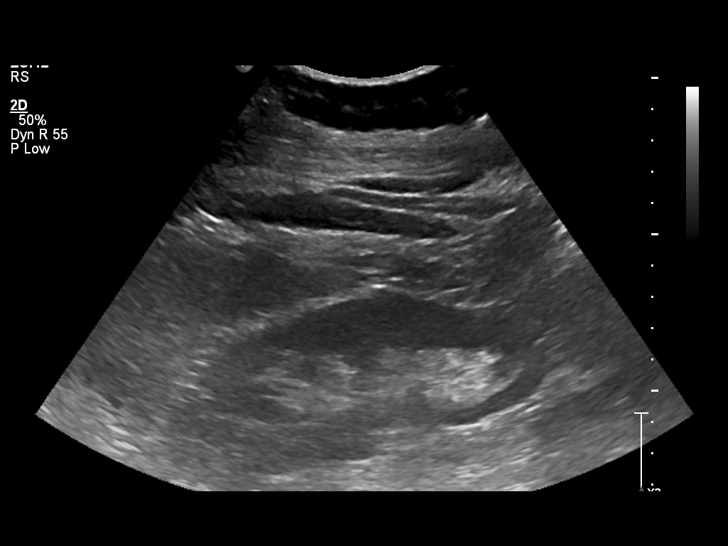
[im 37/37]
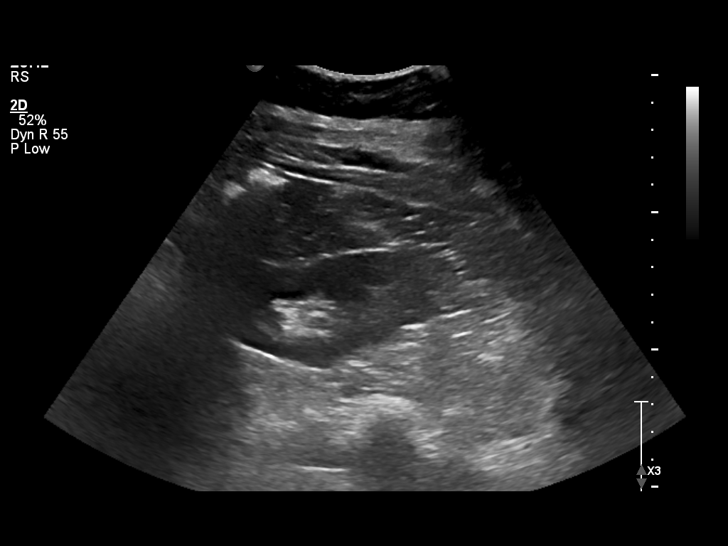

[14 of 25 positions shown; findings below may reference images not displayed]

FINDINGS: LIVER:  NO mass or abnormal enlargement.

Moderate increase in echogenicity of the liver parenchyma.

No intrahepatic bile duct dilation.

GALLBLADDER:  Postsurgical changes from prior cholecystectomy.

COMMON BILE DUCT:  CBD is within normal limits.

PANCREAS:  NO evidence of edema/inflammatory changes. NO definite mass.

KIDNEYS:  NO hydronephrosis, obstructing stones or masses on the RIGHT.

NO hydronephrosis, obstructing stones or masses on the LEFT.

SPLEEN:  NO mass or abnormal enlargement.

AORTA:  The abdominal aorta demonstrates no evidence of aneurysm or rupture as visualized.

INFERIOR VENA CAVA:  Vascular flow is demonstrated in the IVC.
IMPRESSION: - Moderate increase in echogenicity of the liver parenchyma.  The differential includes fatty
changes and/or cirrhosis.

Tech Notes:

ENLARGED LIVER; ORDER READS IMAGES OF LIVER AND SPLEEN

## 2023-06-09 ENCOUNTER — Encounter: Admit: 2023-06-09 | Discharge: 2023-06-09
